# Patient Record
Sex: Male | Born: 1970 | Race: Black or African American | Hispanic: No | Marital: Married | Smoking: Heavy tobacco smoker
Health system: Southern US, Community
[De-identification: ages and names within clinical notes are randomized; demographics above are authoritative.]

## PROBLEM LIST (undated history)

## (undated) DIAGNOSIS — E785 Hyperlipidemia, unspecified: Secondary | ICD-10-CM

## (undated) DIAGNOSIS — R569 Unspecified convulsions: Secondary | ICD-10-CM

## (undated) HISTORY — PX: APPENDECTOMY: SHX54

---

## 2006-06-19 ENCOUNTER — Emergency Department: Payer: Self-pay | Admitting: General Practice

## 2006-06-19 ENCOUNTER — Other Ambulatory Visit: Payer: Self-pay

## 2007-04-14 ENCOUNTER — Emergency Department: Payer: Self-pay | Admitting: Emergency Medicine

## 2007-06-08 ENCOUNTER — Emergency Department: Payer: Self-pay

## 2008-02-13 ENCOUNTER — Emergency Department: Payer: Self-pay | Admitting: Emergency Medicine

## 2009-03-15 ENCOUNTER — Emergency Department: Payer: Self-pay | Admitting: Emergency Medicine

## 2009-10-10 ENCOUNTER — Emergency Department: Payer: Self-pay | Admitting: Emergency Medicine

## 2010-09-18 ENCOUNTER — Inpatient Hospital Stay: Payer: Self-pay | Admitting: Psychiatry

## 2011-01-29 ENCOUNTER — Inpatient Hospital Stay: Payer: Self-pay | Admitting: Internal Medicine

## 2011-12-18 ENCOUNTER — Emergency Department: Payer: Self-pay | Admitting: Emergency Medicine

## 2012-05-02 ENCOUNTER — Emergency Department: Payer: Self-pay | Admitting: Emergency Medicine

## 2012-09-27 ENCOUNTER — Inpatient Hospital Stay: Payer: Self-pay | Admitting: Psychiatry

## 2012-09-27 LAB — COMPREHENSIVE METABOLIC PANEL
Bilirubin,Total: 0.5 mg/dL (ref 0.2–1.0)
Calcium, Total: 8.7 mg/dL (ref 8.5–10.1)
Chloride: 106 mmol/L (ref 98–107)
Co2: 25 mmol/L (ref 21–32)
Creatinine: 1.21 mg/dL (ref 0.60–1.30)
EGFR (African American): >60
EGFR (Non-African Amer.): >60
Osmolality: 278 (ref 275–301)
Potassium: 2.9 mmol/L — ABNORMAL LOW (ref 3.5–5.1)
SGOT(AST): 25 U/L (ref 15–37)
SGPT (ALT): 19 U/L (ref 12–78)

## 2012-09-27 LAB — CBC
HCT: 39.6 % — ABNORMAL LOW (ref 40.0–52.0)
HGB: 13.3 g/dL (ref 13.0–18.0)
MCH: 33 pg (ref 26.0–34.0)
MCHC: 33.7 g/dL (ref 32.0–36.0)
MCV: 98 fL (ref 80–100)
Platelet: 153 10*3/uL (ref 150–440)
RBC: 4.04 10*6/uL — ABNORMAL LOW (ref 4.40–5.90)
RDW: 13.7 % (ref 11.5–14.5)
WBC: 8.1 10*3/uL (ref 3.8–10.6)

## 2012-09-27 LAB — DRUG SCREEN, URINE
Amphetamines, Ur Screen: NEGATIVE (ref ?–1000)
Benzodiazepine, Ur Scrn: NEGATIVE (ref ?–200)
Cocaine Metabolite,Ur ~~LOC~~: NEGATIVE (ref ?–300)
MDMA (Ecstasy)Ur Screen: NEGATIVE (ref ?–500)
Methadone, Ur Screen: NEGATIVE (ref ?–300)
Phencyclidine (PCP) Ur S: NEGATIVE (ref ?–25)
Tricyclic, Ur Screen: NEGATIVE (ref ?–1000)

## 2012-09-27 LAB — ACETAMINOPHEN LEVEL: Acetaminophen: <2 ug/mL

## 2012-09-27 LAB — ETHANOL
Ethanol %: <0.003 % (ref 0.000–0.080)
Ethanol: <3 mg/dL

## 2012-09-27 LAB — TSH: Thyroid Stimulating Horm: 3.13 u[IU]/mL

## 2012-09-28 LAB — BASIC METABOLIC PANEL
Anion Gap: 8 (ref 7–16)
BUN: 9 mg/dL (ref 7–18)
Co2: 23 mmol/L (ref 21–32)
Creatinine: 0.9 mg/dL (ref 0.60–1.30)
EGFR (African American): >60
EGFR (Non-African Amer.): >60
Osmolality: 280 (ref 275–301)
Sodium: 141 mmol/L (ref 136–145)

## 2013-01-22 ENCOUNTER — Emergency Department: Payer: Self-pay | Admitting: Emergency Medicine

## 2013-01-22 LAB — CBC
HGB: 11.5 g/dL — ABNORMAL LOW (ref 13.0–18.0)
MCHC: 31.5 g/dL — ABNORMAL LOW (ref 32.0–36.0)
MCV: 97 fL (ref 80–100)
Platelet: 123 10*3/uL — ABNORMAL LOW (ref 150–440)
RBC: 3.75 10*6/uL — ABNORMAL LOW (ref 4.40–5.90)
RDW: 13.5 % (ref 11.5–14.5)

## 2013-01-22 LAB — COMPREHENSIVE METABOLIC PANEL
Albumin: 3.6 g/dL (ref 3.4–5.0)
Anion Gap: 13 (ref 7–16)
BUN: 13 mg/dL (ref 7–18)
Bilirubin,Total: 0.3 mg/dL (ref 0.2–1.0)
Calcium, Total: 8.5 mg/dL (ref 8.5–10.1)
Chloride: 106 mmol/L (ref 98–107)
Co2: 21 mmol/L (ref 21–32)
Creatinine: 1.21 mg/dL (ref 0.60–1.30)
EGFR (African American): >60
EGFR (Non-African Amer.): >60
Glucose: 95 mg/dL (ref 65–99)
SGOT(AST): 43 U/L — ABNORMAL HIGH (ref 15–37)
Sodium: 140 mmol/L (ref 136–145)

## 2013-01-22 LAB — LIPASE, BLOOD: Lipase: 92 U/L (ref 73–393)

## 2013-01-22 LAB — PROTIME-INR: Prothrombin Time: 14 secs (ref 11.5–14.7)

## 2013-01-22 LAB — ETHANOL
Ethanol %: 0.2 % — ABNORMAL HIGH (ref 0.000–0.080)
Ethanol: 200 mg/dL

## 2013-02-17 ENCOUNTER — Encounter: Payer: Self-pay | Admitting: Family Medicine

## 2015-02-19 NOTE — H&P (Signed)
PATIENT NAME:  Danny Johnston, [name] MR#:  [id number] DATE OF BIRTH:  [birth year]  DATE OF ADMISSION:  09/27/2012  IDENTIFYING INFORMATION: The patient is a 44 year old African American male not employed and not worked in many years and is currently separated for many years after being married for six months. The comes for inpatient hospitalization on Psychiatry at [facility] with a chief complaint "my mother died at Thanksgiving time 10 years ago and this time is very depressing for me".   HISTORY OF PRESENT ILLNESS: The patient reports that he started feeling depressed just prior to Thanksgiving and he started feeling hopeless and helpless about himself and started having suicidal wishes and thoughts with no plan and so he called the Police Department and got a ride to the hospital. The patient reports that he started drinking alcohol and recently has been drinking on a regular basis about an 18 pack a day. Last drink was two weeks ago as he does not drink on a regular basis and drinks whenever he feels like it, probably in binges.   PAST PSYCHIATRIC HISTORY: History of inpatient hospitalization on Psychiatry three times before. First inpatient hospitalization on Psychiatry was in 1990 at [facility] and then second inpatient hospitalization was in 2002 at [facility]. Third inpatient hospitalization was in November 2011 at [facility]. This is the fourth inpatient hospitalization on Psychiatry. History of suicide attempts on two occasions with overdose on pills. The patient reports that he's not being followed by any psychiatrist at this time and he realizes that he needs to be on depression pills.   According to information obtained from the chart, the patient was discharged from [facility] in November 2011 on the following medications:  1. Flexeril 10 mg p.o. b.i.d. p.r.n. 2. Celexa 40 mg every day.  3. Abilify 5 mg every day.   The patient reports  he's noncompliant with medications, not taking medications, and not being followed by any psychiatrist as recommended.   FAMILY HISTORY OF MENTAL ILLNESS: Father was alcoholic.   PAST MEDICAL AND PAST SURGICAL HISTORY: According to information obtained from the chart, the patient has tonic-clonic seizure disorder and first seizure was in childhood. He had low back injury in 1987 with a slipped disk resulting in low back pain. Status post appendectomy at a very young age. No history of motor vehicle accident. Never been unconscious.   ALLERGIES: Is allergic to aspirin which causes hives.  HOME MEDICATIONS: Currently he is on no medications.   PRIMARY CARE PHYSICIAN: Not being followed by any physician on a regular basis at this time.   ALCOHOL AND DRUGS: The patient reports that he drinks alcohol whenever he can and sometimes he drinks about a 12 pack but not on a regular basis, whenever he feels like it. Admits that he does smoke THC whenever he can. Last smoked it a month ago. Denies using IV drugs. Does admit smoking nicotine cigarettes at a rate of two packs a day for many years.   PHYSICAL EXAMINATION:   VITAL SIGNS: Temperature 97.6, pulse 60 per minute and regular, respirations 18 per minute and regular, blood pressure 126/80 mmHg.   HEENT: Head is normocephalic and atraumatic. Pupils equal, round, and reactive to light and accommodation. Fundi bilaterally benign. EOMS visualized. Tympanic membranes visualized. No exudate.   NECK: Supple. No lymphadenopathy or thyromegaly.  CHEST: Normal expansion. Normal breath sounds heard.   HEART: Normal S1, S2 without any murmurs or gallops.  ABDOMEN: Soft. No organomegaly. Bowel sounds heard.   RECTAL: Deferred.   NEUROLOGIC: Gait is normal. Romberg is negative. Cranial nerves II through XII grossly intact. Deep tendon reflexes 2+. Plantars normal response.   MENTAL STATUS EXAMINATION: The patient is dressed in hospital clothes. He is  drowsy but is easily arousable. He knew where he was and the reason why he was here. No eye contact at all because he keeps his head down and fairly uncooperative. Speech is very soft and low and mumbling and difficult to understand and same question had to be asked again and again. Affect is flat. Mood depressed. Admits that he's feeling depressed and Thanksgiving is coming up and this is the time when his mother died 10 years ago. Does admit feeling hopeless and helpless, worthless and useless about himself because he's not able to do anything about what has happened in the past. Does admit suicidal thoughts and wishes but denies any suicidal plans and wants to get help. Denies auditory or visual hallucinations. Denies hearing things or seeing things. No paranoid or suspicious ideas. Cognition is below average. He could not spell the word world and he could not count money and could not do serial sevens. He knew his date of birth. Insight and judgment guarded.   IMPRESSION:  AXIS I: 1. Major depressive disorder, recurrent, without psychosis. 2. Alcohol dependence/abuse.  3. THC abuse/dependence. 4. Nicotine dependence.  AXIS II: Deferred.   AXIS III: 1. History of tonic/clonic seizure disorder. 2. Chronic low back pain secondary to injury in 1987. 3. Status post appendectomy.   AXIS IV: Severe. Occupation, financial, no family support, and long history of mental illness with depression and alcohol abuse and THC abuse.   AXIS V: GAF 20.   PLAN: The patient is admitted to [facility] for close observation, evaluation, and help. He will be started on medications per detox protocol. In addition, he will be started on Celexa which is increased to 40 mg p.o. daily for control of his depression, Abilify 5 mg p.o. daily which will act as an agent. During the stay in the hospital, he will be given milieu therapy and supportive counseling. He will take part in individual and group therapy with  coping skills and grief counseling and bereavement counseling will be given so that he can deal with the death of his mother on the anniversary of his mother's death. In addition, substance abuse counseling will be given. At the time of discharge, the patient will have enough coping skills in dealing with stress of death of his mother and dealing with during Thanksgiving time. Appropriate follow-up appointment will be made in the community.   ____________________________ Jannet Mantis. Guss Bunde, MD skc:drc D: 09/27/2012 20:12:10 ET T: 09/28/2012 06:14:37 ET JOB#: [id number]  cc: Monika Salk K. Guss Bunde, MD, <Dictator> Beau Fanny MD ELECTRONICALLY SIGNED 09/28/2012 15:15

## 2015-02-19 NOTE — H&P (Signed)
PATIENT NAME:  Danny Johnston, Danny Johnston MR#:  [id number] DATE OF BIRTH:  January 25, 1971  DATE OF ADMISSION:  09/27/2012  IDENTIFYING INFORMATION: A 44 year old man came to the Emergency Room reporting suicidal ideation.   CHIEF COMPLAINT: His chief complaint to me, "suicidal thoughts and my mother's death."   HISTORY OF PRESENT ILLNESS: Information is obtained from the patient and from the chart. The patient says that he has been feeling more depressed recently. He is having suicidal thoughts. He is vague about whether he has a specific plan but says that they have been getting worse and more persistent. He feels like nobody loves him and nobody wants him around. He has negative thoughts about himself frequently. He is sleeping poorly at night with poor appetite. He denies any hallucinations. He denies homicidal ideation. He is not currently taking any psychiatric medicine. He has been off of it for probably almost a year, maybe even more. He admits that he was drinking heavily until last week but says that he stopped before the weekend. He admits that he still uses marijuana 2 or 3 times a week. The patient also has the stress of breaking up with a girlfriend earlier this week. He had been living with her for about a month and then she told him she did not want him to live there anymore, so he had to leave. This is apparently the most acute thing that got him really depressed. Right now he says that he can stay with a cousin when he gets out of the hospital, but he just feels very bad about himself.   PAST PSYCHIATRIC HISTORY: The patient has had previous admissions to the hospital for severe depression. He has had admissions as far back here as 2002. He has a history of making serious suicide attempts in the past. He has been on medications, including Depakote and Prozac. Last time he was in the hospital, he was put on Abilify and Celexa and seem to have a good response to them. He tells me that he did follow up for a  while with outpatient treatment but after a while was taken off the Abilify. He thinks that was a mistake because he thought that the Abilify was helpful. The patient has a history of getting easily overwhelmed and depressed. He has a past history of pseudoseizure behaviors when his anxiety is particularly high. He also evidently has a history of chronic developmental disability, the degree of which is not clear to me.   SUBSTANCE ABUSE HISTORY: He has not always had a substance abuse problem, but the alcohol seems to have started in the last few years. He says that he binge drinks. He denies having seizures or delirium tremens. He admits that he still uses marijuana regularly. He has no insight into that having an effect on his mood.   SOCIAL HISTORY: The patient's parents are both deceased, having both died about 10 years ago. Ever since then, it seems like it has been hard for him to take care of himself. He does get an SSI check but does not have a stable place to live. Recently he has been staying with family. As I mentioned, he recently was staying with a woman who then put him out of the house, which is the acute trigger for his depression.   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 Otherwise, he knows of no significant ongoing  medical problems. He has a past history of iron deficiency anemia and chronic thrombocytopenia.   REVIEW OF SYSTEMS: He complains of depressed mood, fatigue, lack of interest in normal activities, suicidal thoughts. He denies auditory or visual hallucinations. He denies homicidal ideation. He denies anger problems or anxiety attacks.   MENTAL STATUS EXAM: A thin, slightly disheveled man who looks his stated age. He was passively cooperative. He made almost no eye contact during the interview. He looked down at his lap the whole time. Psychomotor activity was extremely slow and  restricted. Speech was quiet and decreased in total amount. Thoughts are notable for thought blocking and slowness. No grossly bizarre or disorganized statements. He denies auditory or visual hallucinations. He endorses suicidal ideation without a specific plan. He denies homicidal ideation. Baseline intelligence is low average to possibly mild MR. Short-term memory appears to grossly be intact. Insight and judgment are somewhat impaired.   PHYSICAL EXAMINATION:  GENERAL: A thin man who does not appear to be in any acute physical distress. His skin is dry and has some old scars but no acute wounds on it.   HEENT: Pupils are equal and reactive. Face is symmetric. Dentition is poor but not acutely damaged. Oral mucosa are dry.   NECK AND BACK: Nontender.   NEUROMUSCULOSKELETAL: Gait is normal. Full range of motion in all extremities. Strength and reflexes are normal upper and lower and symmetric. Cranial nerves are symmetric and normal.  LUNGS: Clear without wheezing.   HEART: Regular rate and rhythm. Normal sounds.   ABDOMEN: Soft, nontender, normal bowel sounds.   VITAL SIGNS: Temperature 97.7, pulse 54, respirations 18, blood pressure 136/80.   LABORATORY RESULTS: Labs in the Emergency Room show a drug screen positive for cannabis, otherwise negative. TSH normal at 3.13. Alcohol undetectable. Potassium is low at 2.9, glucose elevated at 115. CBC does show a hematocrit low at 39.6. Platelets are normal but at the low end at 153. Salicylates are slightly elevated but not toxic at 5.8. Acetaminophen not detected.   CURRENT MEDICATIONS: Not taking any.   ALLERGIES: Aspirin and Tylenol.   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 He is  having suicidal ideation. Poor self care. He is feeling very badly about himself. He has not been getting outpatient treatment recently and has still been abusing substances. He needs hospitalization for stabilization of depression and improvement of suicidal thinking. He does not appear right now to probably physically need alcohol withdrawal treatment but will be monitored for it. Also he  has a low potassium which is going to be supplemented.   TREATMENT PLAN: In the Emergency Room, orders were put in to supplement him with oral potassium, and we will recheck the level tomorrow and follow up with that. I am putting him back on citalopram 20 mg a day as well as Abilify 5 mg a day which he was on previously. Supportive therapy, education, and encouragement were done. I encouraged him to go to groups and be active talking about his stressors. The full treatment team will work him up and see if we can help with his social situation as well.   DIAGNOSIS, PRINCIPAL AND PRIMARY:  AXIS I: Major depression, severe, recurrent.   SECONDARY DIAGNOSES:  AXIS I: Alcohol dependence.  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   AXIS III: No diagnosis.   AXIS IV: Severe from lack of a place to live and minimal social support.   AXIS V: Functioning at time of evaluation 35.    ____________________________ Audery Amel, MD jtc:cbb D: 09/27/2012 14:36:30 ET T: 09/27/2012 15:16:05 ET JOB#: [id number]  cc: Audery Amel, MD, <Dictator> Audery Amel MD ELECTRONICALLY SIGNED 09/27/2012 16:01

## 2015-02-19 NOTE — Discharge Summary (Signed)
PATIENT NAME:  Danny Johnston, Danny Johnston MR#:  [id number] DATE OF BIRTH:  [birth year]  DATE OF ADMISSION:  09/27/2012 DATE OF DISCHARGE:  10/03/2012  HOSPITAL COURSE: See dictated History and Physical for details of admission. This 44 year old man was brought to the hospital under involuntary petition. He was stating that his mood was extremely depressed. He was having suicidal thoughts. He was thinking about the fact that his mother had died around Thanksgiving. He was feeling hopeless and somewhat agitated. He had been off of his medication and also had returned to drinking recently although he had stopped probably a week or so prior to the hospital stay. He did not have a stable place to continue to live. He had been using marijuana frequently. The patient was cooperative and pleasant and showed good insight about his depression. In the past he had had a good response to a combination of Celexa and Abilify. He was restarted on Celexa 40 mg a day and Abilify at 5 mg a day. He has tolerated these medicines well. Over the last several days he has shown a substantial increase in his mood, his optimism.  He has upbeat thinking. On interview today the patient is smiling and upbeat. He totally denies any suicidal ideation. He is able to articulate several positive things about his life that he is looking forward to and wants to live for. He says it is very important for him to stop drinking and stop using drugs because he knows those have been holding him back. [facility] and has [facility]. The patient has been educated about [facility] and states that he is very willing and interested in going there. After that he thinks he would like to be in a group home and definitely wants to continue taking his psychiatric medicine. He has denied today any kind of psychotic symptoms. He is not aggressive or violent. He has not had any medication side effects that are obvious. He will  be transferred today under his involuntary commitment directly to [facility] for follow-up treatment.   LABORATORY STUDIES: Admission labs showed a chemistry with a slightly elevated glucose of 115 on a nonfasting draw. Potassium low at 2.9. Alcohol undetectable. TSH normal at 3.13. Drug screen positive for cannabis. On followup on the next day, his potassium had normalized to 3.8. His CBC showed a slightly low hematocrit at 39.6 and has not been repeated. Acetaminophen normal. Salicylates slightly elevated at 5.8.   DISCHARGE MEDICATIONS:  1. Citalopram 40 mg per day.  2. Abilify 5 mg per day.   MENTAL STATUS EXAM AT DISCHARGE: Casually dressed, neatly groomed man who looks his stated age. Pleasant and cooperative in the interview. Good eye contact. Normal psychomotor activity. Speech is normal in rate, tone, and volume. Affect is smiling, upbeat, reactive, and appropriate. Mood is stated as being good. Thoughts appear lucid with no loosening of associations or delusional thinking. No sign of thought disorder. Denies auditory or visual hallucinations. Denies suicidal or homicidal ideation. Insight and judgment are good. Short and long-term memory intact. It is not clear whether he has a chronic developmental disability. If he does, it is mild at most. There may be a little slowness to his thinking but it is not enough to impair his ability to make good use of therapy.   DISPOSITION: Transfer to [facility].   DIAGNOSIS PRINCIPLE AND PRIMARY:  AXIS I: Major depression, severe, recurrent, with psychotic features.   SECONDARY DIAGNOSES:  AXIS I:  1. Alcohol dependence.  2. Marijuana abuse.   AXIS II: Rule out developmental disability, not otherwise specified.   AXIS III: No diagnosis.   AXIS IV: Severe, currently from homelessness and recent lack of treatment.   AXIS V: Functioning at time of discharge: 55.   ____________________________ Audery Amel, MD jtc:bjt D: 10/03/2012 11:18:55  ET T: 10/03/2012 11:38:57 ET JOB#: [id number]  cc: Audery Amel, MD, <Dictator> Audery Amel MD ELECTRONICALLY SIGNED 10/03/2012 17:47

## 2015-02-19 NOTE — Consult Note (Signed)
Brief Consult Note: Diagnosis: MDE.   Patient was seen by consultant.   Recommend further assessment or treatment.   Orders entered.   Comments: Psychiatry: Patient with severe depression and SI. Slow, hopeless, not functioning. Also alcohol dep. Neds admission to treat SI and depression and restart outpt trreatment. Admission workup and orders done.  Electronic Signatures: Audery Amellapacs, Nadie Fiumara T (MD)  (Signed [phone]-Nov-13 14:37)  Authored: Brief Consult Note   Last Updated: 26-Nov-13 14:37 by Audery Amellapacs, Mccoy Testa T (MD)

## 2016-01-16 ENCOUNTER — Emergency Department
Admission: EM | Admit: 2016-01-16 | Discharge: 2016-01-16 | Disposition: A | Discharge status: Home / Self Care | Payer: Medicaid Other | Attending: Emergency Medicine | Admitting: Emergency Medicine

## 2016-01-16 ENCOUNTER — Encounter: Payer: Self-pay | Admitting: *Deleted

## 2016-01-16 DIAGNOSIS — W25XXXA Contact with sharp glass, initial encounter: Secondary | ICD-10-CM | POA: Diagnosis not present

## 2016-01-16 DIAGNOSIS — Y9289 Other specified places as the place of occurrence of the external cause: Secondary | ICD-10-CM | POA: Diagnosis not present

## 2016-01-16 DIAGNOSIS — Y998 Other external cause status: Secondary | ICD-10-CM | POA: Insufficient documentation

## 2016-01-16 DIAGNOSIS — F17210 Nicotine dependence, cigarettes, uncomplicated: Secondary | ICD-10-CM | POA: Diagnosis not present

## 2016-01-16 DIAGNOSIS — Y9389 Activity, other specified: Secondary | ICD-10-CM | POA: Insufficient documentation

## 2016-01-16 DIAGNOSIS — Z79899 Other long term (current) drug therapy: Secondary | ICD-10-CM | POA: Insufficient documentation

## 2016-01-16 DIAGNOSIS — S81812A Laceration without foreign body, left lower leg, initial encounter: Secondary | ICD-10-CM

## 2016-01-16 HISTORY — DX: Unspecified convulsions: R56.9

## 2016-01-16 MED ORDER — LIDOCAINE HCL (PF) 1 % IJ SOLN
INTRAMUSCULAR | Status: AC
  Filled 2016-01-16: qty 10

## 2016-01-16 MED ORDER — LIDOCAINE HCL (PF) 1 % IJ SOLN
10.0000 mL | Freq: Once | INTRAMUSCULAR | Status: DC
Start: 2016-01-16 — End: 2016-01-16

## 2016-01-16 NOTE — ED Notes (Signed)
See triage note   States he was helping to break down some metal  And a glass broke  Small laceration noted to left lower leg

## 2016-01-16 NOTE — ED Notes (Signed)
Pt sustained a laceration to left leg while working with a piece of  Glass, no bleeding noted, dressing applied

## 2016-01-16 NOTE — ED Provider Notes (Signed)
[facility] Emergency Department Provider Note  ____________________________________________  Time seen: Approximately 12:45 PM  I have reviewed the triage vital signs and the nursing notes.   HISTORY  Chief Complaint Laceration   HPI Danny Johnston is a 45 y.o. male is here with complaint of laceration to his left leg while taking aluminum off a large piece of glass. Patient states that the glass broke and he has a small laceration to the left lower leg. He states he has had a tetanus immunization within last 5 years.He rates his pain as 5/10.   Past Medical History  Diagnosis Date  . Seizures (HCC)     There are no active problems to display for this patient.   History reviewed. No pertinent past surgical history.  Current Outpatient Rx  Name  Route  Sig  Dispense  Refill  . divalproex (DEPAKOTE ER) 500 MG 24 hr tablet   Oral   Take 500 mg by mouth daily.           Allergies Review of patient's allergies indicates no known allergies.  No family history on file.  Social History Social History  Substance Use Topics  . Smoking status: Heavy Tobacco Smoker -- 2.00 packs/day    Types: Cigarettes  . Smokeless tobacco: None  . Alcohol Use: No    Review of Systems Constitutional: No fever/chills Cardiovascular: Denies chest pain. Respiratory: Denies shortness of breath. Skin: Positive laceration. Neurological: Negative for  focal weakness or numbness.  10-point ROS otherwise negative.  ____________________________________________   PHYSICAL EXAM:  VITAL SIGNS: ED Triage Vitals  Enc Vitals Group     BP 01/16/16 1050 125/82 mmHg     Pulse Rate 01/16/16 1050 82     Resp 01/16/16 1050 20     Temp 01/16/16 1050 97.8 F (36.6 C)     Temp Source 01/16/16 1050 Oral     SpO2 01/16/16 1050 99 %     Weight 01/16/16 1050 160 lb (72.576 kg)     Height 01/16/16 1050  (1.88 m)     Head Cir --      Peak Flow --      Pain Score  01/16/16 1050 5     Pain Loc --      Pain Edu? --      Excl. in GC? --     Constitutional: Alert and oriented. Well appearing and in no acute distress. Eyes: Conjunctivae are normal. PERRL. EOMI. Head: Atraumatic. Nose: No congestion/rhinnorhea. Neck: No stridor.   Cardiovascular: Normal rate, regular rhythm. Grossly normal heart sounds.  Good peripheral circulation. Respiratory: Normal respiratory effort.  No retractions. Lungs CTAB. Musculoskeletal: No lower extremity tenderness nor edema.   Neurologic:  Normal speech and language. No gross focal neurologic deficits are appreciated. No gait instability. Skin:  Left lateral leg there is a 3.5 cm laceration without active bleeding. No foreign body is noted. Psychiatric: Mood and affect are normal. Speech and behavior are normal.  ____________________________________________   LABS (all labs ordered are listed, but only abnormal results are displayed)  Labs Reviewed - No data to display  PROCEDURES  Procedure(s) performed: LACERATION REPAIR Performed by: Tommi Rumps Authorized by: Tommi Rumps Consent: Verbal consent obtained. Risks and benefits: risks, benefits and alternatives were discussed Consent given by: patient Patient identity confirmed: provided demographic data Prepped and Draped in normal sterile fashion Wound explored  Laceration Location: Left lower lateral leg.  Laceration Length: 3.5 cm  No Foreign  Bodies seen or palpated  Anesthesia: local infiltration  Local anesthetic: lidocaine 1 % without epinephrine  Anesthetic total: 8 ml  Irrigation method: syringe Amount of cleaning: standard  Skin closure: 4-0 Ethilon   Number of sutures: 10  Technique: Simple interrupted   Patient tolerance: Patient tolerated the procedure well with no immediate complications.  Critical Care performed: No  ____________________________________________   INITIAL IMPRESSION / ASSESSMENT AND PLAN / ED  COURSE  Pertinent labs & imaging results that were available during my care of the patient were reviewed by me and considered in my medical decision making (see chart for details).  Patient denies the need for narcotics for pain. He is instructed to keep area clean and dry and watch for signs of infection. He is also given wound care instructions. He is to return to the emergency room if unable to go to urgent care for suture removal in 10-12 days. ____________________________________________   FINAL CLINICAL IMPRESSION(S) / ED DIAGNOSES  Final diagnoses:  Laceration of left leg, initial encounter      Tommi RumpsRhonda L Summers, PA-C 01/16/16 1528  Jennye MoccasinBrian S Quigley, MD 01/16/16 1547

## 2016-01-16 NOTE — Discharge Instructions (Signed)
Laceration Care, Adult  A laceration is a cut that goes through all layers of the skin. The cut also goes into the tissue that is right under the skin. Some cuts heal on their own. Others need to be closed with stitches (sutures), staples, skin adhesive strips, or wound glue. Taking care of your cut lowers your risk of infection and helps your cut to heal better.  HOW TO TAKE CARE OF YOUR CUT  For stitches or staples:  · Keep the wound clean and dry.  · If you were given a bandage (dressing), you should change it at least one time per day or as told by your doctor. You should also change it if it gets wet or dirty.  · Keep the wound completely dry for the first 24 hours or as told by your doctor. After that time, you may take a shower or a bath. However, make sure that the wound is not soaked in water until after the stitches or staples have been removed.  · Clean the wound one time each day or as told by your doctor:    Wash the wound with soap and water.    Rinse the wound with water until all of the soap comes off.    Pat the wound dry with a clean towel. Do not rub the wound.  · After you clean the wound, put a thin layer of antibiotic ointment on it as told by your doctor. This ointment:    Helps to prevent infection.    Keeps the bandage from sticking to the wound.  · Have your stitches or staples removed as told by your doctor.  If your doctor used skin adhesive strips:   · Keep the wound clean and dry.  · If you were given a bandage, you should change it at least one time per day or as told by your doctor. You should also change it if it gets dirty or wet.  · Do not get the skin adhesive strips wet. You can take a shower or a bath, but be careful to keep the wound dry.  · If the wound gets wet, pat it dry with a clean towel. Do not rub the wound.  · Skin adhesive strips fall off on their own. You can trim the strips as the wound heals. Do not remove any strips that are still stuck to the wound. They will  fall off after a while.  If your doctor used wound glue:  · Try to keep your wound dry, but you may briefly wet it in the shower or bath. Do not soak the wound in water, such as by swimming.  · After you take a shower or a bath, gently pat the wound dry with a clean towel. Do not rub the wound.  · Do not do any activities that will make you really sweaty until the skin glue has fallen off on its own.  · Do not apply liquid, cream, or ointment medicine to your wound while the skin glue is still on.  · If you were given a bandage, you should change it at least one time per day or as told by your doctor. You should also change it if it gets dirty or wet.  · If a bandage is placed over the wound, do not let the tape for the bandage touch the skin glue.  · Do not pick at the glue. The skin glue usually stays on for 5-10 days. Then, it   or when wound glue stays in place and the wound is healed. Make sure to wear a sunscreen of at least 30 SPF. °· Take over-the-counter and prescription medicines only as told by your doctor. °· If you were given antibiotic medicine or ointment, take or apply it as told by your doctor. Do not stop using the antibiotic even if your wound is getting better. °· Do not scratch or pick at the wound. °· Keep all follow-up visits as told by your doctor. This is important. °· Check your wound every day for signs of infection. Watch for: °¨ Redness, swelling, or pain. °¨ Fluid, blood, or pus. °· Raise (elevate) the injured area above the level of your heart while you are sitting or lying down, if possible. °GET HELP IF: °· You got a tetanus shot and you have any of these problems at the injection site: °¨ Swelling. °¨ Very bad pain. °¨ Redness. °¨ Bleeding. °· You have a fever. °· A wound that was  closed breaks open. °· You notice a bad smell coming from your wound or your bandage. °· You notice something coming out of the wound, such as wood or glass. °· Medicine does not help your pain. °· You have more redness, swelling, or pain at the site of your wound. °· You have fluid, blood, or pus coming from your wound. °· You notice a change in the color of your skin near your wound. °· You need to change the bandage often because fluid, blood, or pus is coming from the wound. °· You start to have a new rash. °· You start to have numbness around the wound. °GET HELP RIGHT AWAY IF: °· You have very bad swelling around the wound. °· Your pain suddenly gets worse and is very bad. °· You notice painful lumps near the wound or on skin that is anywhere on your body. °· You have a red streak going away from your wound. °· The wound is on your hand or foot and you cannot move a finger or toe like you usually can. °· The wound is on your hand or foot and you notice that your fingers or toes look pale or bluish. °  °This information is not intended to replace advice given to you by your health care provider. Make sure you discuss any questions you have with your health care provider. °  °Document Released: 04/06/2008 Document Revised: 03/05/2015 Document Reviewed: 10/15/2014 °Elsevier Interactive Patient Education ©2016 [employer] ° ° °WOUND CARE °Please return in 10-12 days to have your °stitches/staples removed or sooner if you have concerns. °• Keep area clean and dry for 24 hours. Do not remove °bandage, if applied. °• After 24 hours, remove bandage and wash wound °gently with mild soap and warm water. Reapply °a new bandage after cleaning wound, if directed. °• Continue daily cleansing with soap and water until °stitches/staples are removed. °• Do not apply any ointments or creams to the wound °while stitches/staples are in place, as this may cause °delayed healing. °• Notify the office if you experience any of the  following °signs of infection: Swelling, redness, pus drainage, °streaking, fever >101.0 F °• Notify the office if you experience excessive bleeding °that does not stop after 15-20 minutes of constant, firm °pressure. ° ° ° °

## 2016-01-31 ENCOUNTER — Emergency Department
Admission: EM | Admit: 2016-01-31 | Discharge: 2016-01-31 | Disposition: A | Discharge status: Home / Self Care | Payer: Medicaid Other | Attending: Student | Admitting: Student

## 2016-01-31 ENCOUNTER — Encounter: Payer: Self-pay | Admitting: Emergency Medicine

## 2016-01-31 DIAGNOSIS — Z4802 Encounter for removal of sutures: Secondary | ICD-10-CM

## 2016-01-31 DIAGNOSIS — F17210 Nicotine dependence, cigarettes, uncomplicated: Secondary | ICD-10-CM | POA: Insufficient documentation

## 2016-01-31 DIAGNOSIS — R569 Unspecified convulsions: Secondary | ICD-10-CM | POA: Insufficient documentation

## 2016-01-31 NOTE — ED Notes (Signed)
Patient comes into ED via POV for suture removal from left lower leg.  Patient in NAD at this time.

## 2016-01-31 NOTE — Discharge Instructions (Signed)

## 2016-01-31 NOTE — ED Provider Notes (Signed)
[facility] Emergency Department Provider Note  ____________________________________________  Time seen: Approximately 11:41 AM  I have reviewed the triage vital signs and the nursing notes.   HISTORY  Chief Complaint Suture / Staple Removal    HPI Danny RodneyGlenn L Brownstein Jr. is a 45 y.o. male patient today for suture removal from left lower leg which was performed this ED on 01/16/2016. Patient state no complaints.   Past Medical History  Diagnosis Date  . Seizures (HCC)     There are no active problems to display for this patient.   History reviewed. No pertinent past surgical history.  Current Outpatient Rx  Name  Route  Sig  Dispense  Refill  . divalproex (DEPAKOTE ER) 500 MG 24 hr tablet   Oral   Take 500 mg by mouth daily.           Allergies Review of patient's allergies indicates no known allergies.  No family history on file.  Social History Social History  Substance Use Topics  . Smoking status: Heavy Tobacco Smoker -- 2.00 packs/day    Types: Cigarettes  . Smokeless tobacco: None  . Alcohol Use: No    Review of Systems Constitutional: No fever/chills Eyes: No visual changes. ENT: No sore throat. Cardiovascular: Denies chest pain. Respiratory: Denies shortness of breath. Gastrointestinal: No abdominal pain.  No nausea, no vomiting.  No diarrhea.  No constipation. Genitourinary: Negative for dysuria. Musculoskeletal: Negative for back pain. Skin: Negative for rash. Laceration left lower leg Neurological: Negative for headaches, focal weakness or numbness. .  ____________________________________________   PHYSICAL EXAM:  VITAL SIGNS: ED Triage Vitals  Enc Vitals Group     BP 01/31/16 1128 122/71 mmHg     Pulse Rate 01/31/16 1128 69     Resp 01/31/16 1128 16     Temp 01/31/16 1128 98 F (36.7 C)     Temp Source 01/31/16 1128 Oral     SpO2 01/31/16 1128 97 %     Weight 01/31/16 1128 160 lb (72.576 kg)     Height  01/31/16 1128 6\' 3"  (1.905 m)     Head Cir --      Peak Flow --      Pain Score --      Pain Loc --      Pain Edu? --      Excl. in GC? --     Constitutional: Alert and oriented. Well appearing and in no acute distress. Eyes: Conjunctivae are normal. PERRL. EOMI. Head: Atraumatic. Nose: No congestion/rhinnorhea. Mouth/Throat: Mucous membranes are moist.  Oropharynx non-erythematous. Neck: No stridor.  No cervical spine tenderness to palpation. Cardiovascular: Normal rate, regular rhythm. Grossly normal heart sounds.  Good peripheral circulation. Respiratory: Normal respiratory effort.  No retractions. Lungs CTAB. Gastrointestinal: Soft and nontender. No distention. No abdominal bruits. No CVA tenderness. Musculoskeletal: No lower extremity tenderness nor edema.  No joint effusions. Neurologic:  Normal speech and language. No gross focal neurologic deficits are appreciated. No gait instability. Skin:  Skin is warm, dry and intact. No rash noted. Healed laceration left lower leg Psychiatric: Mood and affect are normal. Speech and behavior are normal.  ____________________________________________   LABS (all labs ordered are listed, but only abnormal results are displayed)  Labs Reviewed - No data to display ____________________________________________  EKG   ____________________________________________  RADIOLOGY   ____________________________________________   PROCEDURES  Procedure(s) performed: None  Critical Care performed: No  ____________________________________________   INITIAL IMPRESSION / ASSESSMENT AND PLAN / ED  COURSE  Pertinent labs & imaging results that were available during my care of the patient were reviewed by me and considered in my medical decision making (see chart for details).  Healed laceration left lower leg 10 sutures were removed area was cleaned and repaired bandage. Patient given advice on wound care. Advised return back to ER if the  wound reopens. ____________________________________________   FINAL CLINICAL IMPRESSION(S) / ED DIAGNOSES  Final diagnoses:  Encounter for removal of sutures      Joni Reining, PA-C 01/31/16 1151  Gayla Doss, MD 01/31/16 [phone]

## 2016-04-22 DIAGNOSIS — Y999 Unspecified external cause status: Secondary | ICD-10-CM | POA: Diagnosis not present

## 2016-04-22 DIAGNOSIS — Y939 Activity, unspecified: Secondary | ICD-10-CM | POA: Insufficient documentation

## 2016-04-22 DIAGNOSIS — S61217A Laceration without foreign body of left little finger without damage to nail, initial encounter: Secondary | ICD-10-CM | POA: Diagnosis not present

## 2016-04-22 DIAGNOSIS — W293XXA Contact with powered garden and outdoor hand tools and machinery, initial encounter: Secondary | ICD-10-CM | POA: Insufficient documentation

## 2016-04-22 DIAGNOSIS — F17210 Nicotine dependence, cigarettes, uncomplicated: Secondary | ICD-10-CM | POA: Insufficient documentation

## 2016-04-22 DIAGNOSIS — Y929 Unspecified place or not applicable: Secondary | ICD-10-CM | POA: Diagnosis not present

## 2016-04-23 ENCOUNTER — Emergency Department
Admission: EM | Admit: 2016-04-23 | Discharge: 2016-04-23 | Disposition: A | Discharge status: Home / Self Care | Payer: Medicaid Other | Attending: Emergency Medicine | Admitting: Emergency Medicine

## 2016-04-23 ENCOUNTER — Emergency Department: Payer: Medicaid Other

## 2016-04-23 DIAGNOSIS — S61217A Laceration without foreign body of left little finger without damage to nail, initial encounter: Secondary | ICD-10-CM

## 2016-04-23 MED ORDER — LIDOCAINE HCL (PF) 1 % IJ SOLN
5.0000 mL | Freq: Once | INTRAMUSCULAR | Status: AC
  Administered 2016-04-23: 5 mL via INTRADERMAL

## 2016-04-23 MED ORDER — CEPHALEXIN 500 MG PO CAPS
500.0000 mg | ORAL_CAPSULE | Freq: Once | ORAL | Status: AC
Start: 2016-04-23 — End: 2016-04-23
  Administered 2016-04-23: 500 mg via ORAL
  Filled 2016-04-23: qty 1

## 2016-04-23 MED ORDER — CEPHALEXIN 500 MG PO CAPS: 500.0000 mg | ORAL_CAPSULE | Freq: Once | ORAL | Status: DC

## 2016-04-23 MED ORDER — CEPHALEXIN 500 MG PO CAPS: 500.0000 mg | ORAL_CAPSULE | Freq: Two times a day (BID) | ORAL | Status: AC

## 2016-04-23 MED ORDER — BACITRACIN ZINC 500 UNIT/GM EX OINT
TOPICAL_OINTMENT | Freq: Once | CUTANEOUS | Status: AC
  Administered 2016-04-23: 1 via TOPICAL
  Filled 2016-04-23: qty 0.9

## 2016-04-23 MED ORDER — LIDOCAINE HCL (PF) 1 % IJ SOLN
INTRAMUSCULAR | Status: AC
  Administered 2016-04-23: 5 mL via INTRADERMAL
  Filled 2016-04-23: qty 5

## 2016-04-23 NOTE — ED Notes (Signed)
Patient was trimming hedges with an electric trimmer and cut his left fifth digit. No bleeding at the present time, new dressing placed. Patient will require laceration repair. Motor/sensation intact.

## 2016-04-23 NOTE — ED Provider Notes (Signed)
[facility] Emergency Department Provider Note  ____________________________________________  Time seen: 5:00 AM  I have reviewed the triage vital signs and the nursing notes.   HISTORY  Chief Complaint Laceration     HPI Danny RodneyGlenn L Jurgensen Jr. is a 45 y.o. male presents with left fifth finger laceration sustained while cutting "bushes yesterday at approximately 5 PM with an electric trimmer. Patient states current pain score is 5 out of 10.   Past Medical History  Diagnosis Date  . Seizures (HCC)     There are no active problems to display for this patient.   History reviewed. No pertinent past surgical history.  Current Outpatient Rx  Name  Route  Sig  Dispense  Refill  . divalproex (DEPAKOTE ER) 500 MG 24 hr tablet   Oral   Take 500 mg by mouth daily.           Allergies Review of patient's allergies indicates no known allergies.  No family history on file.  Social History Social History  Substance Use Topics  . Smoking status: Heavy Tobacco Smoker -- 2.00 packs/day    Types: Cigarettes  . Smokeless tobacco: None  . Alcohol Use: No    Review of Systems  Constitutional: Negative for fever. Eyes: Negative for visual changes. ENT: Negative for sore throat. Cardiovascular: Negative for chest pain. Respiratory: Negative for shortness of breath. Gastrointestinal: Negative for abdominal pain, vomiting and diarrhea. Genitourinary: Negative for dysuria. Musculoskeletal: Negative for back pain. Skin: Negative for rash.Positive for left fifth finger laceration Neurological: Negative for headaches, focal weakness or numbness.   10-point ROS otherwise negative.  ____________________________________________   PHYSICAL EXAM:  VITAL SIGNS: ED Triage Vitals  Enc Vitals Group     BP 04/23/16 0056 123/80 mmHg     Pulse Rate 04/23/16 0056 57     Resp 04/23/16 0056 18     Temp 04/23/16 0056 97.6 F (36.4 C)     Temp Source 04/23/16  0056 Oral     SpO2 04/23/16 0056 100 %     Weight 04/23/16 0056 150 lb (68.04 kg)     Height 04/23/16 0056 6\' 3"  (1.905 m)     Head Cir --      Peak Flow --      Pain Score 04/23/16 0057 8     Pain Loc --      Pain Edu? --      Excl. in GC? --      Constitutional: Alert and oriented. Well appearing and in no distress. Eyes: Conjunctivae are normal. PERRL. Normal extraocular movements. ENT   Head: Normocephalic and atraumatic.   Nose: No congestion/rhinnorhea.   Mouth/Throat: Mucous membranes are moist.   Neck: No stridor. Hematological/Lymphatic/Immunilogical: No cervical lymphadenopathy. Cardiovascular: Normal rate, regular rhythm. Normal and symmetric distal pulses are present in all extremities. No murmurs, rubs, or gallops. Respiratory: Normal respiratory effort without tachypnea nor retractions. Breath sounds are clear and equal bilaterally. No wheezes/rales/rhonchi. Gastrointestinal: Soft and nontender. No distention. There is no CVA tenderness. Genitourinary: deferred Musculoskeletal: Nontender with normal range of motion in all extremities. No joint effusions.  No lower extremity tenderness nor edema. Neurologic:  Normal speech and language. No gross focal neurologic deficits are appreciated. Speech is normal.  Skin:  Skin is warm, dry and intact. No rash noted.2 cm left fifth finger distal laceration Psychiatric: Mood and affect are normal. Speech and behavior are normal. Patient exhibits appropriate insight and judgment.  Procedure note: LACERATION REPAIR Performed by:  [place] N Eddie Payette Authorized by: Darci CurrentANDOLPH N Karolyne Timmons Consent: Verbal consent obtained. Risks and benefits: risks, benefits and alternatives were discussed Consent given by: patient Patient identity confirmed: provided demographic data Prepped and Draped in normal sterile fashion Wound explored  Laceration Location: Left fifth finger distally  Laceration Length: 2cm  No Foreign Bodies seen  or palpated  Anesthesia: local infiltration  Local anesthetic: lidocaine 1%   Anesthetic total: 2 ml  Irrigation method: syringe Amount of cleaning: standard  Skin closure: 5-0 nylon   Number of sutures: 6  Technique: Simple interrupted   Patient tolerance: Patient tolerated the procedure well with no immediate complications.    INITIAL IMPRESSION / ASSESSMENT AND PLAN / ED COURSE  Pertinent labs & imaging results that were available during my care of the patient were reviewed by me and considered in my medical decision making (see chart for details).    ____________________________________________   FINAL CLINICAL IMPRESSION(S) / ED DIAGNOSES  Final diagnoses:  Laceration of fifth finger, left, initial encounter      Darci Currentandolph N Aamya Orellana, MD 04/23/16 [phone]

## 2016-04-23 NOTE — ED Notes (Signed)
Pt presents to ED with c/o laceration to 5th digit on LEFT hand. Pt states he was attempting to unplug a corded electric hedge trimmer when he accidentally depressed the trigger switch while doing so. Wound not visualized at this time as pt presents to ED Room 19 with injury covered with gauze and wrapped in Kerlex from Triage (very small amount of blood noted to have saturated gauze, bleeding controlled at this time); will assess in greater depth when EDP presents to see pt.

## 2016-04-23 NOTE — ED Notes (Signed)
Pt reports Tetanus shot is UTD.

## 2016-04-23 NOTE — Discharge Instructions (Signed)

## 2016-04-23 NOTE — ED Notes (Signed)
Pt presents with an approximately 1-1.25 inch laceration across the distal pad on the 5th digit. No bleeding noted at this time. Dr Manson PasseyBrown at bedside to suture wound.

## 2016-09-09 ENCOUNTER — Encounter: Payer: Self-pay | Admitting: Emergency Medicine

## 2016-09-09 ENCOUNTER — Emergency Department
Admission: EM | Admit: 2016-09-09 | Discharge: 2016-09-10 | Disposition: A | Discharge status: Other Acute Inpatient Hospital | Payer: Medicaid Other | Attending: Emergency Medicine | Admitting: Emergency Medicine

## 2016-09-09 DIAGNOSIS — R45851 Suicidal ideations: Secondary | ICD-10-CM | POA: Diagnosis present

## 2016-09-09 DIAGNOSIS — F17210 Nicotine dependence, cigarettes, uncomplicated: Secondary | ICD-10-CM | POA: Insufficient documentation

## 2016-09-09 DIAGNOSIS — Z79899 Other long term (current) drug therapy: Secondary | ICD-10-CM | POA: Insufficient documentation

## 2016-09-09 DIAGNOSIS — F331 Major depressive disorder, recurrent, moderate: Secondary | ICD-10-CM | POA: Diagnosis not present

## 2016-09-09 DIAGNOSIS — F10120 Alcohol abuse with intoxication, uncomplicated: Secondary | ICD-10-CM | POA: Insufficient documentation

## 2016-09-09 DIAGNOSIS — F1410 Cocaine abuse, uncomplicated: Secondary | ICD-10-CM | POA: Diagnosis not present

## 2016-09-09 DIAGNOSIS — F332 Major depressive disorder, recurrent severe without psychotic features: Secondary | ICD-10-CM | POA: Diagnosis not present

## 2016-09-09 DIAGNOSIS — F1420 Cocaine dependence, uncomplicated: Secondary | ICD-10-CM

## 2016-09-09 DIAGNOSIS — F1020 Alcohol dependence, uncomplicated: Secondary | ICD-10-CM

## 2016-09-09 DIAGNOSIS — F10920 Alcohol use, unspecified with intoxication, uncomplicated: Secondary | ICD-10-CM

## 2016-09-09 LAB — COMPREHENSIVE METABOLIC PANEL
ALT: 29 U/L (ref 17–63)
AST: 36 U/L (ref 15–41)
Albumin: 4.8 g/dL (ref 3.5–5.0)
Alkaline Phosphatase: 60 U/L (ref 38–126)
Anion gap: 10 (ref 5–15)
BILIRUBIN TOTAL: 0.7 mg/dL (ref 0.3–1.2)
BUN: 7 mg/dL (ref 6–20)
CHLORIDE: 105 mmol/L (ref 101–111)
CO2: 26 mmol/L (ref 22–32)
CREATININE: 1.11 mg/dL (ref 0.61–1.24)
Calcium: 9.4 mg/dL (ref 8.9–10.3)
Glucose, Bld: 105 mg/dL — ABNORMAL HIGH (ref 65–99)
Potassium: 3.9 mmol/L (ref 3.5–5.1)
Sodium: 141 mmol/L (ref 135–145)
TOTAL PROTEIN: 8.3 g/dL — AB (ref 6.5–8.1)

## 2016-09-09 LAB — URINE DRUG SCREEN, QUALITATIVE (ARMC ONLY)
Amphetamines, Ur Screen: NOT DETECTED
BARBITURATES, UR SCREEN: NOT DETECTED
BENZODIAZEPINE, UR SCRN: NOT DETECTED
CANNABINOID 50 NG, UR ~~LOC~~: NOT DETECTED
Cocaine Metabolite,Ur ~~LOC~~: POSITIVE — AB
MDMA (Ecstasy)Ur Screen: NOT DETECTED
Methadone Scn, Ur: NOT DETECTED
Opiate, Ur Screen: NOT DETECTED
PHENCYCLIDINE (PCP) UR S: NOT DETECTED
TRICYCLIC, UR SCREEN: NOT DETECTED

## 2016-09-09 LAB — CBC
HCT: 39.2 % — ABNORMAL LOW (ref 40.0–52.0)
Hemoglobin: 13.5 g/dL (ref 13.0–18.0)
MCH: 32.8 pg (ref 26.0–34.0)
MCHC: 34.4 g/dL (ref 32.0–36.0)
MCV: 95.3 fL (ref 80.0–100.0)
PLATELETS: 127 10*3/uL — AB (ref 150–440)
RBC: 4.11 MIL/uL — ABNORMAL LOW (ref 4.40–5.90)
RDW: 13 % (ref 11.5–14.5)
WBC: 6.3 10*3/uL (ref 3.8–10.6)

## 2016-09-09 LAB — ETHANOL: ALCOHOL ETHYL (B): 214 mg/dL — AB (ref <5)

## 2016-09-09 LAB — ACETAMINOPHEN LEVEL: Acetaminophen (Tylenol), Serum: <10 ug/mL — ABNORMAL LOW (ref 10–30)

## 2016-09-09 LAB — SALICYLATE LEVEL

## 2016-09-09 NOTE — ED Provider Notes (Signed)
[facility] Emergency Department Provider Note   ____________________________________________   First MD Initiated Contact with Patient 09/09/16 2310     (approximate)  I have reviewed the triage vital signs and the nursing notes.   HISTORY  Chief Complaint Mental Health Problem    HPI Danny RodneyGlenn L Rooke Jr. is a 45 y.o. male brought to the ED via [employer]'s deputy with a chief complaint of depression with suicidal thoughts. Patient reports both his parents died in the month of November and he is feeling very upset. Admits to heavy EtOH use tonight. Denies plan for suicide but feels like he would harm himself. Denies HI/AH/VH. Voices no medical complaints.Nothing makes his symptoms better or worse. Reports last seizure 6-7 years ago.   Past Medical History:  Diagnosis Date  . Seizures (HCC)     There are no active problems to display for this patient.   Past Surgical History:  Procedure Laterality Date  . APPENDECTOMY      Prior to Admission medications   Medication Sig Start Date End Date Taking? Authorizing Provider  divalproex (DEPAKOTE ER) 500 MG 24 hr tablet Take 500 mg by mouth daily.    Historical Provider, MD    Allergies Aspirin  No family history on file.  Social History Social History  Substance Use Topics  . Smoking status: Heavy Tobacco Smoker    Packs/day: 2.00    Types: Cigarettes  . Smokeless tobacco: Never Used  . Alcohol use Yes     Comment: consumed 2 pints of "white liquor" this evening    Review of Systems  Constitutional: No fever/chills. Eyes: No visual changes. ENT: No sore throat. Cardiovascular: Denies chest pain. Respiratory: Denies shortness of breath. Gastrointestinal: No abdominal pain.  No nausea, no vomiting.  No diarrhea.  No constipation. Genitourinary: Negative for dysuria. Musculoskeletal: Negative for back pain. Skin: Negative for rash. Neurological: Negative for headaches, focal weakness or  numbness. Psychiatric:Positive for depression with suicidal ideation.  10-point ROS otherwise negative.  ____________________________________________   PHYSICAL EXAM:  VITAL SIGNS: ED Triage Vitals [09/09/16 2255]  Enc Vitals Group     BP 117/75     Pulse Rate 92     Resp 18     Temp 98.1 F (36.7 C)     Temp Source Oral     SpO2 96 %     Weight 147 lb (66.7 kg)     Height 6\' 2"  (1.88 m)     Head Circumference      Peak Flow      Pain Score      Pain Loc      Pain Edu?      Excl. in GC?     Constitutional: Alert and oriented. Well appearing and in no acute distress. Intoxicated. Eyes: Conjunctivae are normal. PERRL. EOMI. Head: Atraumatic. Nose: No congestion/rhinnorhea. Mouth/Throat: Mucous membranes are moist.  Oropharynx non-erythematous. Neck: No stridor.   Cardiovascular: Normal rate, regular rhythm. Grossly normal heart sounds.  Good peripheral circulation. Respiratory: Normal respiratory effort.  No retractions. Lungs CTAB. Gastrointestinal: Soft and nontender. No distention. No abdominal bruits. No CVA tenderness. Musculoskeletal: No lower extremity tenderness nor edema.  No joint effusions. Neurologic:  Normal speech and language. No gross focal neurologic deficits are appreciated. No gait instability. Skin:  Skin is warm, dry and intact. No rash noted. Psychiatric: Mood and affect are tearful. Speech and behavior are normal.  ____________________________________________   LABS (all labs ordered are listed, but only abnormal  results are displayed)  Labs Reviewed  COMPREHENSIVE METABOLIC PANEL - Abnormal; Notable for the following:       Result Value   Glucose, Bld 105 (*)    Total Protein 8.3 (*)    All other components within normal limits  ETHANOL - Abnormal; Notable for the following:    Alcohol, Ethyl (B) 214 (*)    All other components within normal limits  ACETAMINOPHEN LEVEL - Abnormal; Notable for the following:    Acetaminophen (Tylenol),  Serum <10 (*)    All other components within normal limits  CBC - Abnormal; Notable for the following:    RBC 4.11 (*)    HCT 39.2 (*)    Platelets 127 (*)    All other components within normal limits  URINE DRUG SCREEN, QUALITATIVE ([facility] ONLY) - Abnormal; Notable for the following:    Cocaine Metabolite,Ur [place] POSITIVE (*)    All other components within normal limits  SALICYLATE LEVEL   ____________________________________________  EKG  None ____________________________________________  RADIOLOGY  None ____________________________________________   PROCEDURES  Procedure(s) performed: None  Procedures  Critical Care performed: No  ____________________________________________   INITIAL IMPRESSION / ASSESSMENT AND PLAN / ED COURSE  Pertinent labs & imaging results that were available during my care of the patient were reviewed by me and considered in my medical decision making (see chart for details).  45 year old male who presents with severe depression with suicidal ideation. Will place patient under involuntary commitment pending TTS and psychiatry consults.  Clinical Course as of Sep 10 34  Wed Sep 09, 2016  2358 Laboratory urinalysis results notable for elevated EtOH level and positive cocaine metabolites. At this time patient is medically cleared for psychiatric evaluation and disposition. [facility] and psychiatry consults.  [JS]    Clinical Course User Index [JS] Irean HongJade J Sung, MD     ____________________________________________   FINAL CLINICAL IMPRESSION(S) / ED DIAGNOSES  Final diagnoses:  Moderate episode of recurrent major depressive disorder (HCC)  Alcoholic intoxication without complication (HCC)  Cocaine abuse      NEW MEDICATIONS STARTED DURING THIS VISIT:  New Prescriptions   No medications on file     Note:  This document was prepared using Dragon voice recognition software and may include  unintentional dictation errors.    Irean HongJade J Sung, MD 09/10/16 [phone]

## 2016-09-09 NOTE — ED Triage Notes (Addendum)
Pt comes into the Ed via [employer] where he was picked up for suicidal thoughts.  Per the Deputies the patient expressed to them that he felt like walking out in front of traffic.  Patient presents tearful in the triage room.  He states that November is the anniversary of his parents death and he is feeling very upset about it like he does every year. Patient states the only plan that was made was for him to walk out in traffic today, but denies any other plans recently. Patient has ETOH abuse on board where he consumed 2 pints of white liquor.

## 2016-09-10 ENCOUNTER — Inpatient Hospital Stay
Admission: RE | Admit: 2016-09-10 | Discharge: 2016-09-21 | DRG: 885 | Disposition: A | Discharge status: Home / Self Care | Payer: Medicaid Other | Source: Intra-hospital | Attending: Psychiatry | Admitting: Psychiatry

## 2016-09-10 DIAGNOSIS — F1020 Alcohol dependence, uncomplicated: Secondary | ICD-10-CM | POA: Diagnosis present

## 2016-09-10 DIAGNOSIS — J189 Pneumonia, unspecified organism: Secondary | ICD-10-CM

## 2016-09-10 DIAGNOSIS — Z9114 Patient's other noncompliance with medication regimen: Secondary | ICD-10-CM

## 2016-09-10 DIAGNOSIS — F1420 Cocaine dependence, uncomplicated: Secondary | ICD-10-CM | POA: Diagnosis present

## 2016-09-10 DIAGNOSIS — G40909 Epilepsy, unspecified, not intractable, without status epilepticus: Secondary | ICD-10-CM | POA: Diagnosis present

## 2016-09-10 DIAGNOSIS — R5381 Other malaise: Secondary | ICD-10-CM

## 2016-09-10 DIAGNOSIS — F332 Major depressive disorder, recurrent severe without psychotic features: Secondary | ICD-10-CM | POA: Diagnosis present

## 2016-09-10 DIAGNOSIS — R45851 Suicidal ideations: Secondary | ICD-10-CM | POA: Diagnosis present

## 2016-09-10 DIAGNOSIS — F17210 Nicotine dependence, cigarettes, uncomplicated: Secondary | ICD-10-CM | POA: Diagnosis not present

## 2016-09-10 DIAGNOSIS — R531 Weakness: Secondary | ICD-10-CM | POA: Diagnosis present

## 2016-09-10 DIAGNOSIS — F419 Anxiety disorder, unspecified: Secondary | ICD-10-CM | POA: Diagnosis present

## 2016-09-10 DIAGNOSIS — E8881 Metabolic syndrome: Secondary | ICD-10-CM | POA: Diagnosis present

## 2016-09-10 DIAGNOSIS — G4700 Insomnia, unspecified: Secondary | ICD-10-CM | POA: Diagnosis present

## 2016-09-10 DIAGNOSIS — R9431 Abnormal electrocardiogram [ECG] [EKG]: Secondary | ICD-10-CM | POA: Diagnosis present

## 2016-09-10 DIAGNOSIS — E538 Deficiency of other specified B group vitamins: Secondary | ICD-10-CM | POA: Diagnosis present

## 2016-09-10 DIAGNOSIS — Z79899 Other long term (current) drug therapy: Secondary | ICD-10-CM

## 2016-09-10 DIAGNOSIS — F17200 Nicotine dependence, unspecified, uncomplicated: Secondary | ICD-10-CM | POA: Diagnosis present

## 2016-09-10 DIAGNOSIS — F10120 Alcohol abuse with intoxication, uncomplicated: Secondary | ICD-10-CM | POA: Diagnosis not present

## 2016-09-10 DIAGNOSIS — R262 Difficulty in walking, not elsewhere classified: Secondary | ICD-10-CM

## 2016-09-10 MED ORDER — VITAMIN B-1 100 MG PO TABS
100.0000 mg | ORAL_TABLET | Freq: Every day | ORAL | Status: DC
  Filled 2016-09-10: qty 1

## 2016-09-10 MED ORDER — LORAZEPAM 2 MG/ML IJ SOLN: 1.0000 mg | Freq: Four times a day (QID) | INTRAMUSCULAR | Status: DC | PRN

## 2016-09-10 MED ORDER — FOLIC ACID 1 MG PO TABS
1.0000 mg | ORAL_TABLET | Freq: Every day | ORAL | Status: DC
  Administered 2016-09-10: 1 mg via ORAL
  Filled 2016-09-10: qty 1

## 2016-09-10 MED ORDER — THIAMINE HCL 100 MG/ML IJ SOLN: 100.0000 mg | Freq: Every day | INTRAMUSCULAR | Status: DC

## 2016-09-10 MED ORDER — CITALOPRAM HYDROBROMIDE 20 MG PO TABS
20.0000 mg | ORAL_TABLET | Freq: Every day | ORAL | Status: DC
  Administered 2016-09-10: 20 mg via ORAL
  Filled 2016-09-10: qty 1

## 2016-09-10 MED ORDER — LORAZEPAM 1 MG PO TABS: 1.0000 mg | ORAL_TABLET | Freq: Four times a day (QID) | ORAL | Status: DC | PRN

## 2016-09-10 MED ORDER — ADULT MULTIVITAMIN W/MINERALS CH
1.0000 | ORAL_TABLET | Freq: Every day | ORAL | Status: DC
  Administered 2016-09-10: 1 via ORAL
  Filled 2016-09-10: qty 1

## 2016-09-10 NOTE — ED Notes (Signed)
Patient noted in room. No complaints, stable, in no acute distress. Q15 minute rounds and monitoring via Security Cameras to continue.  

## 2016-09-10 NOTE — BH Assessment (Signed)
Patient accepted to [facility], attending Dr. Jennet MaduroPucilowska, Rm 323.

## 2016-09-10 NOTE — Consult Note (Signed)
[facility] Consult   Reason for Consult:  Consult for 45 year old man with history of alcohol abuse and depression Referring Physician:  Owens Shark Patient Identification: Danny Johnston. MRN:  [id number] Principal Diagnosis: Severe recurrent major depression without psychotic features [facility]) Diagnosis:   Patient Active Problem List   Diagnosis Date Noted  . Severe recurrent major depression without psychotic features ([place]) [F33.2] 09/10/2016  . Suicidal ideation [R45.851] 09/10/2016  . Alcohol abuse [F10.10] 09/10/2016  . Marijuana abuse [F12.10] 09/10/2016    Total Time spent with patient: 1 hour  Subjective:   Danny Johnston. is a 45 y.o. male patient admitted with "I'm just trying to get some help".  HPI:  Patient interviewed. Chart reviewed. 45 year old man presented to the emergency room intoxicated with suicidal thoughts and depression. This morning he is no longer drunk but is able to give a coherent history. He says his mood stays down and sad and depressed pretty much all the time but it gets worse this time of year. He attributes that to the fact that his mother died around the Thanksgiving holiday in 2004. Sleep is poor. Appetite is poor. Motivation is poor. Negative thinking. Having some suicidal thoughts but with only vague plans such as taking an overdose. He continues to drink heavily and on a regular basis. Says that he drinks a as much as a 12 pack a day. He is vague about whether the amount is changed recently. Also uses marijuana regularly. He has very little positive going on in his life. Little social activity.  Medical history: He says he has a history of a seizure disorder but doesn't take any medicine for it. Makes me wonder if it might of been related to his alcohol use. He says he hasn't actually had a seizure in several years.  Social history: Not married. No communication with any children. Closest relatives are her sister and some cousins. He gets  disability and doesn't work.  Substance abuse history: Long-standing alcohol abuse. Longest sobriety he can recall was about 4 months. He was here in the hospital in 2013 and went to the alcohol and drug abuse treatment center. Doesn't sound like he is engaging in recent substance abuse treatment.  Past Psychiatric History: Patient has had psychiatric admissions here and at the alcohol and drug abuse treatment facility in 2013. He was a client at [facility] but has not been following up there probably in over a year. Used to take Abilify he recalls and according to the chart was also on Celexa but he says he hasn't been on his medicine in over a year. He has had a history of suicidal thoughts and says he has overdosed in the past although he is vague about the history that. Does not report any history of violence or psychosis.  Risk to Self: Suicidal Ideation: Yes-Currently Present Suicidal Intent: Yes-Currently Present Is patient at risk for suicide?: Yes Suicidal Plan?: Yes-Currently Present Specify Current Suicidal Plan: Pt reports a plan to walk out in front of traffic Access to Means: Yes Specify Access to Suicidal Means: access to traffic What has been your use of drugs/alcohol within the last 12 months?: alcohol and cocaine use How many times?: 1 Other Self Harm Risks: none identified Triggers for Past Attempts: Anniversary (anniversary of parents' deaths) Intentional Self Injurious Behavior: None Risk to Others: Homicidal Ideation: No Thoughts of Harm to Others: No Current Homicidal Intent: No Current Homicidal Plan: No Access to Homicidal Means: No Identified Victim:  none identified History of harm to others?: No Assessment of Violence: None Noted Violent Behavior Description: none identified Does patient have access to weapons?: No Criminal Charges Pending?: No Does patient have a court date: No Prior Inpatient Therapy: Prior Inpatient Therapy: Yes Prior Therapy Dates: 2013 Prior  Therapy Facilty/Provider(s): [facility] Reason for Treatment: depression Prior Outpatient Therapy: Prior Outpatient Therapy: No Prior Therapy Dates: na Prior Therapy Facilty/Provider(s): na Reason for Treatment: na Does patient have an ACCT team?: No Does patient have Intensive In-House Services?  : No Does patient have [facility] services? : No Does patient have P4CC services?: No  Past Medical History:  Past Medical History:  Diagnosis Date  . Seizures ([place])     Past Surgical History:  Procedure Laterality Date  . APPENDECTOMY     Family History: No family history on file. Family Psychiatric  History: Does not know of any family history of mental illness Social History:  History  Alcohol Use  . Yes    Comment: consumed 2 pints of "white liquor" this evening     History  Drug use: Unknown    Social History   Social History  . Marital status: Married    Spouse name: N/A  . Number of children: N/A  . Years of education: N/A   Social History Main Topics  . Smoking status: Heavy Tobacco Smoker    Packs/day: 2.00    Types: Cigarettes  . Smokeless tobacco: Never Used  . Alcohol use Yes     Comment: consumed 2 pints of "white liquor" this evening  . Drug use: Unknown  . Sexual activity: Not Asked   Other Topics Concern  . None   Social History Narrative  . None   Additional Social History:    Allergies:   Allergies  Allergen Reactions  . Acetaminophen     Other reaction(s): Unknown  . Aspirin Swelling    Labs:  Results for orders placed or performed during the hospital encounter of 09/09/16 (from the past 48 hour(s))  Comprehensive metabolic panel     Status: Abnormal   Collection Time: 09/09/16 10:58 PM  Result Value Ref Range   Sodium 141 135 - 145 mmol/L   Potassium 3.9 3.5 - 5.1 mmol/L   Chloride 105 101 - 111 mmol/L   CO2 26 22 - 32 mmol/L   Glucose, Bld 105 (H) 65 - 99 mg/dL   BUN 7 6 - 20 mg/dL   Creatinine, Ser 1.11 0.61 - 1.24 mg/dL   Calcium  9.4 8.9 - 10.3 mg/dL   Total Protein 8.3 (H) 6.5 - 8.1 g/dL   Albumin 4.8 3.5 - 5.0 g/dL   AST 36 15 - 41 U/L   ALT 29 17 - 63 U/L   Alkaline Phosphatase 60 38 - 126 U/L   Total Bilirubin 0.7 0.3 - 1.2 mg/dL   GFR calc non Af Amer >60 >60 mL/min   GFR calc Af Amer >60 >60 mL/min    Comment: (NOTE) The eGFR has been calculated using the CKD EPI equation. This calculation has not been validated in all clinical situations. eGFR's persistently <60 mL/min signify possible Chronic Kidney Disease.    Anion gap 10 5 - 15  Ethanol     Status: Abnormal   Collection Time: 09/09/16 10:58 PM  Result Value Ref Range   Alcohol, Ethyl (B) 214 (H) <5 mg/dL    Comment:        LOWEST DETECTABLE LIMIT FOR SERUM ALCOHOL IS 5 mg/dL FOR  MEDICAL PURPOSES ONLY   Salicylate level     Status: None   Collection Time: 09/09/16 10:58 PM  Result Value Ref Range   Salicylate Lvl <8.2 2.8 - 30.0 mg/dL  Acetaminophen level     Status: Abnormal   Collection Time: 09/09/16 10:58 PM  Result Value Ref Range   Acetaminophen (Tylenol), Serum <10 (L) 10 - 30 ug/mL    Comment:        THERAPEUTIC CONCENTRATIONS VARY SIGNIFICANTLY. A RANGE OF 10-30 ug/mL MAY BE AN EFFECTIVE CONCENTRATION FOR MANY PATIENTS. HOWEVER, SOME ARE BEST TREATED AT CONCENTRATIONS OUTSIDE THIS RANGE. ACETAMINOPHEN CONCENTRATIONS >150 ug/mL AT 4 HOURS AFTER INGESTION AND >50 ug/mL AT 12 HOURS AFTER INGESTION ARE OFTEN ASSOCIATED WITH TOXIC REACTIONS.   cbc     Status: Abnormal   Collection Time: 09/09/16 10:58 PM  Result Value Ref Range   WBC 6.3 3.8 - 10.6 K/uL   RBC 4.11 (L) 4.40 - 5.90 MIL/uL   Hemoglobin 13.5 13.0 - 18.0 g/dL   HCT 39.2 (L) 40.0 - 52.0 %   MCV 95.3 80.0 - 100.0 fL   MCH 32.8 26.0 - 34.0 pg   MCHC 34.4 32.0 - 36.0 g/dL   RDW 13.0 11.5 - 14.5 %   Platelets 127 (L) 150 - 440 K/uL  Urine Drug Screen, Qualitative     Status: Abnormal   Collection Time: 09/09/16 10:58 PM  Result Value Ref Range   Tricyclic, Ur  Screen NONE DETECTED NONE DETECTED   Amphetamines, Ur Screen NONE DETECTED NONE DETECTED   MDMA (Ecstasy)Ur Screen NONE DETECTED NONE DETECTED   Cocaine Metabolite,Ur [place] POSITIVE (A) NONE DETECTED   Opiate, Ur Screen NONE DETECTED NONE DETECTED   Phencyclidine (PCP) Ur S NONE DETECTED NONE DETECTED   Cannabinoid 50 Ng, Ur [place] NONE DETECTED NONE DETECTED   Barbiturates, Ur Screen NONE DETECTED NONE DETECTED   Benzodiazepine, Ur Scrn NONE DETECTED NONE DETECTED   Methadone Scn, Ur NONE DETECTED NONE DETECTED    Comment: (NOTE) 956  Tricyclics, urine               Cutoff 1000 ng/mL 200  Amphetamines, urine             Cutoff 1000 ng/mL 300  MDMA (Ecstasy), urine           Cutoff 500 ng/mL 400  Cocaine Metabolite, urine       Cutoff 300 ng/mL 500  Opiate, urine                   Cutoff 300 ng/mL 600  Phencyclidine (PCP), urine      Cutoff 25 ng/mL 700  Cannabinoid, urine              Cutoff 50 ng/mL 800  Barbiturates, urine             Cutoff 200 ng/mL 900  Benzodiazepine, urine           Cutoff 200 ng/mL 1000 Methadone, urine                Cutoff 300 ng/mL 1100 1200 The urine drug screen provides only a preliminary, unconfirmed 1300 analytical test result and should not be used for non-medical 1400 purposes. Clinical consideration and professional judgment should 1500 be applied to any positive drug screen result due to possible 1600 interfering substances. A more specific alternate chemical method 1700 must be used in order to obtain a confirmed analytical result.  [place] /  mass spectrometry (GC/MS) is the preferred 1900 confirmatory method.     Current Facility-Administered Medications  Medication Dose Route Frequency Provider Last Rate Last Dose  . citalopram (CELEXA) tablet 20 mg  20 mg Oral Daily Gonzella Lex, MD       Current Outpatient Prescriptions  Medication Sig Dispense Refill  . ARIPiprazole (ABILIFY) 5 MG tablet Take 1 tablet by mouth daily.    .  citalopram (CELEXA) 40 MG tablet Take 1 tablet by mouth daily.    . divalproex (DEPAKOTE ER) 500 MG 24 hr tablet Take 500 mg by mouth daily.      Musculoskeletal: Strength & Muscle Tone: decreased Gait & Station: normal Patient leans: N/A  Psychiatric Specialty Exam: Physical Exam  Nursing note and vitals reviewed. Constitutional: He appears well-developed. He appears distressed.  HENT:  Head: Normocephalic and atraumatic.  Eyes: Conjunctivae are normal. Pupils are equal, round, and reactive to light.  Neck: Normal range of motion.  Cardiovascular: Normal heart sounds.   Respiratory: Effort normal.  GI: Soft.  Musculoskeletal: Normal range of motion.  Neurological: He is alert.  Skin: Skin is warm and dry.  Psychiatric: His speech is delayed. He is slowed and withdrawn. He expresses impulsivity. He exhibits a depressed mood. He expresses suicidal ideation. He exhibits abnormal recent memory and abnormal remote memory.    Review of Systems  Constitutional: Negative.   HENT: Negative.   Eyes: Negative.   Respiratory: Negative.   Cardiovascular: Negative.   Gastrointestinal: Negative.   Musculoskeletal: Negative.   Skin: Negative.   Neurological: Negative.   Psychiatric/Behavioral: Positive for depression, memory loss, substance abuse and suicidal ideas. Negative for hallucinations. The patient is nervous/anxious and has insomnia.     Blood pressure 118/65, pulse 64, temperature 98 F (36.7 C), temperature source Oral, resp. rate 20, height _0  (1.88 m), weight 66.7 kg (147 lb), SpO2 100 %.Body mass index is 18.87 kg/m.  General Appearance: Disheveled  Eye Contact:  Minimal  Speech:  Slow  Volume:  Decreased  Mood:  Depressed and Dysphoric  Affect:  Depressed  Thought Process:  Goal Directed  Orientation:  Full (Time, Place, and Person)  Thought Content:  Logical and Rumination  Suicidal Thoughts:  Yes.  with intent/plan  Homicidal Thoughts:  No  Memory:  Immediate;    Good Recent;   Poor Remote;   Fair  Judgement:  Impaired  Insight:  Shallow  Psychomotor Activity:  Decreased  Concentration:  Concentration: Poor  Recall:  [employer] of Knowledge:  Fair  Language:  Fair  Akathisia:  No  Handed:  Right  AIMS (if indicated):     Assets:  Desire for Improvement Financial Resources/Insurance  ADL's:  Intact  Cognition:  Impaired,  Mild  Sleep:        Treatment Plan Summary: Daily contact with patient to assess and evaluate symptoms and progress in treatment, Medication management and Plan 45 year old man with alcohol abuse and depression who reports suicidal ideation. Affect very depressed and dysphoric. Was intoxicated yesterday but sober today. Doesn't look too shaky and his vitals are normal. We can provide detox protocol but he may not need much of it. I'm restarting his citalopram that he had taken before. He will be admitted to the psychiatric unit. Continue IVC for now. 15 minute checks. Engage in appropriate group and individual therapy.  Disposition: Recommend psychiatric Inpatient admission when medically cleared. Supportive therapy provided about ongoing stressors.  Alethia Berthold, MD 09/10/2016 12:23 PM

## 2016-09-10 NOTE — ED Notes (Signed)
[facility], He remains with very flat affect and withdrawn and minimal verbal responses,he is agreeable to plan of admission, A cousin called earlier and he refused to give her any info or code #

## 2016-09-10 NOTE — BH Assessment (Signed)
Assessment Note  Danny RodneyGlenn L Kidd Jr. is an 45 y.o. male [facility] after being picked up for voicing suicidal thoughts.  Patient tearful during assessment and reports always getting depressed around this time of the year.  He states that his mother died Thanksgiving 2003 and his father dies Thanksgiving 2004.  He says that his sister is ill as well and he is concerned about her passing away.  He says that he had thoughts of walking out in front of traffic and getting hit by a car.  He denies intent right now.   Patient admits to alcohol and cocaine use.  BAC was 214. Patient reports a previous hospitalization in 2013.   Diagnosis: Depression; Drug and alcohol use  Past Medical History:  Past Medical History:  Diagnosis Date  . Seizures ([facility])     Past Surgical History:  Procedure Laterality Date  . APPENDECTOMY      Family History: No family history on file.  Social History:  reports that he has been smoking Cigarettes.  He has been smoking about 2.00 packs per day. He has never used smokeless tobacco. He reports that he drinks alcohol. His drug history is not on file.  Additional Social History:  Alcohol / Drug Use History of alcohol / drug use?: Yes (Patient reports a history of alcohol and cocaine use.) Negative Consequences of Use: Personal relationships  CIWA: CIWA-Ar BP: 117/75 Pulse Rate: 92 COWS:    Allergies:  Allergies  Allergen Reactions  . Aspirin Swelling    Home Medications:  (Not in a hospital admission)  OB/GYN Status:  No LMP for male patient.  General Assessment Data [facility] Assessment: In system Is this a Tele or Face-to-Face Assessment?: Face-to-Face Is this an Initial Assessment or a Re-assessment for this encounter?: Initial Assessment Marital status: Single Maiden name: n/a Is patient pregnant?: No Pregnancy Status: No Living Arrangements: Other relatives Can pt return to current  living arrangement?: Yes Admission Status: Voluntary Is patient capable of signing voluntary admission?: Yes Referral Source: Self/Family/Friend Insurance type: Medicaid     Crisis Care Plan Living Arrangements: Other relatives Legal Guardian: Other: (self) Name of Psychiatrist: none reported Name of Therapist: none reported  Education Status Is patient currently in school?: No Current Grade: n/a Highest grade of school patient has completed: 12 Name of school: na Contact person: na  Risk to self with the past 6 months Suicidal Ideation: Yes-Currently Present Has patient been a risk to self within the past 6 months prior to admission? : No Suicidal Intent: Yes-Currently Present Has patient had any suicidal intent within the past 6 months prior to admission? : No Is patient at risk for suicide?: Yes Suicidal Plan?: Yes-Currently Present Has patient had any suicidal plan within the past 6 months prior to admission? : No Specify Current Suicidal Plan: Pt reports a plan to walk out in front of traffic Access to Means: Yes Specify Access to Suicidal Means: access to traffic What has been your use of drugs/alcohol within the last 12 months?: alcohol and cocaine use Previous Attempts/Gestures: Yes How many times?: 1 Other Self Harm Risks: none identified Triggers for Past Attempts: Anniversary (anniversary of parents' deaths) Intentional Self Injurious Behavior: None Family Suicide History: No Recent stressful life event(s): Loss (Comment), Trauma (Comment) Persecutory voices/beliefs?: No Depression: Yes Depression Symptoms: Loss of interest in usual pleasures, Feeling worthless/self pity, Feeling angry/irritable, Tearfulness Substance abuse history and/or treatment for substance abuse?:  Yes Suicide prevention information given to non-admitted patients: Not applicable  Risk to Others within the past 6 months Homicidal Ideation: No Does patient have any lifetime risk of  violence toward others beyond the six months prior to admission? : No Thoughts of Harm to Others: No Current Homicidal Intent: No Current Homicidal Plan: No Access to Homicidal Means: No Identified Victim: none identified History of harm to others?: No Assessment of Violence: None Noted Violent Behavior Description: none identified Does patient have access to weapons?: No Criminal Charges Pending?: No Does patient have a court date: No Is patient on probation?: No  Psychosis Hallucinations: None noted Delusions: None noted  Mental Status Report Appearance/Hygiene: In scrubs Eye Contact: Fair Motor Activity: Freedom of movement Speech: Logical/coherent Level of Consciousness: Crying Affect: Depressed, Sad Anxiety Level: Minimal Thought Processes: Coherent, Relevant Judgement: Partial Orientation: Person, Place, Time, Situation Obsessive Compulsive Thoughts/Behaviors: None  Cognitive Functioning Concentration: Good Memory: Recent Intact, Remote Intact IQ: Average Insight: Fair Impulse Control: Fair Appetite: Fair Weight Loss: 0 Weight Gain: 0 Sleep: Decreased Total Hours of Sleep: 4 Vegetative Symptoms: None  ADLScreening [facility] Assessment Services) Patient's cognitive ability adequate to safely complete daily activities?: Yes Patient able to express need for assistance with ADLs?: Yes Independently performs ADLs?: Yes (appropriate for developmental age)  Prior Inpatient Therapy Prior Inpatient Therapy: Yes Prior Therapy Dates: 2013 [facility] Reason for Treatment: depression  Prior Outpatient Therapy Prior Outpatient Therapy: No Prior Therapy Dates: na Prior Therapy Facilty/Provider(s): na Reason for Treatment: na Does patient have an ACCT team?: No Does patient have Intensive In-House Services?  : No Does patient have [facility] services? : No Does patient have P4CC services?: No  ADL Screening (condition at time of  admission) Patient's cognitive ability adequate to safely complete daily activities?: Yes Patient able to express need for assistance with ADLs?: Yes Independently performs ADLs?: Yes (appropriate for developmental age)       Abuse/Neglect Assessment (Assessment to be complete while patient is alone) Physical Abuse: Denies Verbal Abuse: Denies Sexual Abuse: Denies Exploitation of patient/patient's resources: Denies Self-Neglect: Denies Values / Beliefs Cultural Requests During Hospitalization: None Spiritual Requests During Hospitalization: None Consults Spiritual Care Consult Needed: No Social Work Consult Needed: No Merchant navy officerAdvance Directives (For Healthcare) Does patient have an advance directive?: No Would patient like information on creating an advanced directive?: No - patient declined information    Additional Information 1:1 In Past 12 Months?: No CIRT Risk: No Elopement Risk: No Does patient have medical clearance?: Yes     Disposition:  Disposition Initial Assessment Completed for this Encounter: Yes Disposition of Patient: Other dispositions Other disposition(s): Other (Comment) (Pending Psych MD consult)  On Site Evaluation by:   Reviewed with Physician:    Artist Beachoxana C Kavi Almquist 09/10/2016 12:34 AM

## 2016-09-10 NOTE — ED Notes (Signed)
Pt remains asleep,he was up all night and he just wants to sleep, did not eat breakfast and appears in no distress

## 2016-09-10 NOTE — BH Assessment (Signed)
Patient is to be admitted to [facility] by Dr. Clapacs.  Attending Physician will be Dr. Pucilowska.   Patient has been assigned to room 324, by [facility] Charge Nurse Jennifer.   Intake Paper Work has been signed and placed on patient chart.  ER staff is aware of the admission ( [facility]). 

## 2016-09-10 NOTE — ED Notes (Signed)
Report called to Baird Lyonsasey, Charity fundraiserN.  Preparing patient for transfer to [facility]U.

## 2016-09-10 NOTE — ED Notes (Signed)
Patient affect is flat and his mood is sad and depressed.  Patient is withdrawn.  Patient in bed with blankets over his head.

## 2016-09-11 DIAGNOSIS — F17200 Nicotine dependence, unspecified, uncomplicated: Secondary | ICD-10-CM | POA: Diagnosis present

## 2016-09-11 DIAGNOSIS — G40909 Epilepsy, unspecified, not intractable, without status epilepticus: Secondary | ICD-10-CM

## 2016-09-11 LAB — TSH: TSH: 0.684 u[IU]/mL (ref 0.350–4.500)

## 2016-09-11 MED ORDER — CITALOPRAM HYDROBROMIDE 20 MG PO TABS
20.0000 mg | ORAL_TABLET | Freq: Every day | ORAL | Status: DC
  Administered 2016-09-11: 20 mg via ORAL
  Filled 2016-09-11: qty 1

## 2016-09-11 MED ORDER — THIAMINE HCL 100 MG/ML IJ SOLN
100.0000 mg | Freq: Every day | INTRAMUSCULAR | Status: DC
  Filled 2016-09-11: qty 2

## 2016-09-11 MED ORDER — PNEUMOCOCCAL VAC POLYVALENT 25 MCG/0.5ML IJ INJ
0.5000 mL | INJECTION | INTRAMUSCULAR | Status: AC
  Administered 2016-09-12: 0.5 mL via INTRAMUSCULAR
  Filled 2016-09-11: qty 0.5

## 2016-09-11 MED ORDER — ADULT MULTIVITAMIN W/MINERALS CH
1.0000 | ORAL_TABLET | Freq: Every day | ORAL | Status: DC
  Administered 2016-09-11 – 2016-09-21 (×11): 1 via ORAL
  Filled 2016-09-11 (×11): qty 1

## 2016-09-11 MED ORDER — CHLORDIAZEPOXIDE HCL 25 MG PO CAPS
25.0000 mg | ORAL_CAPSULE | Freq: Four times a day (QID) | ORAL | Status: AC
  Administered 2016-09-11 – 2016-09-13 (×12): 25 mg via ORAL
  Filled 2016-09-11 (×12): qty 1

## 2016-09-11 MED ORDER — FOLIC ACID 1 MG PO TABS
1.0000 mg | ORAL_TABLET | Freq: Every day | ORAL | Status: DC
  Administered 2016-09-11 – 2016-09-21 (×11): 1 mg via ORAL
  Filled 2016-09-11 (×11): qty 1

## 2016-09-11 MED ORDER — ALUM & MAG HYDROXIDE-SIMETH 200-200-20 MG/5ML PO SUSP: 30.0000 mL | ORAL | Status: DC | PRN

## 2016-09-11 MED ORDER — VITAMIN B-1 100 MG PO TABS
100.0000 mg | ORAL_TABLET | Freq: Every day | ORAL | Status: DC
  Administered 2016-09-11 – 2016-09-21 (×11): 100 mg via ORAL
  Filled 2016-09-11 (×11): qty 1

## 2016-09-11 MED ORDER — FLUOXETINE HCL 20 MG PO CAPS
20.0000 mg | ORAL_CAPSULE | Freq: Every day | ORAL | Status: DC
  Administered 2016-09-11 – 2016-09-15 (×5): 20 mg via ORAL
  Filled 2016-09-11 (×5): qty 1

## 2016-09-11 MED ORDER — DIVALPROEX SODIUM 500 MG PO DR TAB
500.0000 mg | DELAYED_RELEASE_TABLET | Freq: Three times a day (TID) | ORAL | Status: DC
  Administered 2016-09-11 – 2016-09-14 (×10): 500 mg via ORAL
  Filled 2016-09-11 (×10): qty 1

## 2016-09-11 MED ORDER — LORAZEPAM 2 MG/ML IJ SOLN: 1.0000 mg | Freq: Four times a day (QID) | INTRAMUSCULAR | Status: DC | PRN

## 2016-09-11 MED ORDER — TRAZODONE HCL 100 MG PO TABS
100.0000 mg | ORAL_TABLET | Freq: Every day | ORAL | Status: DC
  Administered 2016-09-11 – 2016-09-20 (×9): 100 mg via ORAL
  Filled 2016-09-11 (×9): qty 1

## 2016-09-11 MED ORDER — NICOTINE 21 MG/24HR TD PT24
21.0000 mg | MEDICATED_PATCH | Freq: Every day | TRANSDERMAL | Status: DC
  Filled 2016-09-11 (×6): qty 1

## 2016-09-11 MED ORDER — MAGNESIUM HYDROXIDE 400 MG/5ML PO SUSP: 30.0000 mL | Freq: Every day | ORAL | Status: DC | PRN

## 2016-09-11 MED ORDER — LORAZEPAM 1 MG PO TABS: 1.0000 mg | ORAL_TABLET | Freq: Four times a day (QID) | ORAL | Status: DC | PRN

## 2016-09-11 NOTE — Plan of Care (Signed)
Problem: Medication: Goal: Compliance with prescribed medication regimen will improve Outcome: Progressing Patient medication compliant   

## 2016-09-11 NOTE — B[facility] Counselor (Signed)
[name] went to patient's room and attempted to wake him to complete psychosocial assessment. Patient would not wake up after multiple prompts from [name] and is unable to complete assessment at this time. [name] will attempt to complete assessment tomorrow.   Danny Johnston G. Garnette CzechSampson MSW, [facility]SWA 09/11/2016 2:47 PM

## 2016-09-11 NOTE — Progress Notes (Signed)
Recreation Therapy Notes  Date: 11.10.17 Time: 1:00 pm Location: Craft Room  Group Topic: Social Skills  Goal Area(s) Addresses:  Patient will participate in healthy coping skill. Patient will verbalize benefit of using art as a coping skill.  Behavioral Response: Inattentive  Intervention: Glass blower/designeripe Cleaner Tower  Activity: Patients were given 15 pipe cleaners and were instructed to build a free standing tower. Patients were given 2 minutes to strategize. After about 5 minutes of building, patients were instructed to put their dominant hand behind their backs and continue building.  Education: LRT educated patients on healthy support systems.  Education Outcome: In group clarification offered  Clinical Observations/Feedback: Patient did not participate in activity. Patient did not contribute to group discussion.  Jacquelynn CreeGreene,Adham Johnson M, LRT/CTRS 09/11/2016 2:59 PM

## 2016-09-11 NOTE — Tx Team (Signed)
Initial Treatment Plan 09/11/2016 12:33 AM Danny RodneyGlenn L Babic Jr. ZOX:[id number]    PATIENT STRESSORS: Medication change or noncompliance Substance abuse   PATIENT STRENGTHS: General fund of knowledge Motivation for treatment/growth   PATIENT IDENTIFIED PROBLEMS: "I'm tired of feeling this way."  Suicidal Ideations   Substance abuse                  DISCHARGE CRITERIA:  Improved stabilization in mood, thinking, and/or behavior  PRELIMINARY DISCHARGE PLAN: Outpatient therapy  PATIENT/FAMILY INVOLVEMENT: This treatment plan has been presented to and reviewed with the patient, Danny RodneyGlenn L Eichler Jr., and/or family member.  The patient and family have been given the opportunity to ask questions and make suggestions.  [name], RN 09/11/2016, 12:33 AM

## 2016-09-11 NOTE — Progress Notes (Signed)
Pt presents with sad, flat affect. Pt with passive SI, but contracts for safety. Pt reports it is the anniversary of his parents death. Pt denies HI, AVH. Isolates to self and room. Pt extremely drowsy most of shift and only came out for meals or meds. Encouragement and support offered. Medications given as prescribed. CIWA scores low. No prn needed. Will continue to assess and monitor with q 15 min checks.

## 2016-09-11 NOTE — [facility] Suicide Risk Assessment (Signed)
[facility]   Nursing information obtained from:  Patient Demographic factors:  Male, Divorced or widowed, Low socioeconomic status, Unemployed Current Mental Status:  Suicidal ideation indicated by patient, Self-harm thoughts Loss Factors:  NA Historical Factors:  Anniversary of important loss Risk Reduction Factors:  Living with another person, especially a relative  Total Time spent with patient: 1 hour Principal Problem: Severe recurrent major depression without psychotic features (HCC) Diagnosis:   Patient Active Problem List   Diagnosis Date Noted  . Tobacco use disorder [F17.200] 09/11/2016  . Seizure disorder (HCC) [G40.909] 09/11/2016  . Severe recurrent major depression without psychotic features (HCC) [F33.2] 09/10/2016  . Suicidal ideation [R45.851] 09/10/2016  . Alcohol use disorder, moderate, dependence (HCC) [F10.20] 09/10/2016  . Cocaine use disorder, moderate, dependence (HCC) [F14.20] 09/10/2016   Subjective Data: Suicidal ideation, substance abuse.  Continued Clinical Symptoms:  Alcohol Use Disorder Identification Test Final Score (AUDIT): 20 The "Alcohol Use Disorders Identification Test", Guidelines for Use in Primary Care, Second Edition.  World Science writerHealth Organization [facility](WHO). Score between 0-7:  no or low risk or alcohol related problems. Score between 8-15:  moderate risk of alcohol related problems. Score between 16-19:  high risk of alcohol related problems. Score 20 or above:  warrants further diagnostic evaluation for alcohol dependence and treatment.   CLINICAL FACTORS:   Depression:   Comorbid alcohol abuse/dependence Impulsivity Alcohol/Substance Abuse/Dependencies Epilepsy   Musculoskeletal: Strength & Muscle Tone: within normal limits Gait & Station: normal Patient leans: N/A  Psychiatric Specialty Exam: Physical Exam  Nursing note and vitals reviewed.   Review of Systems  Neurological: Positive for seizures.   Psychiatric/Behavioral: Positive for depression, substance abuse and suicidal ideas.  All other systems reviewed and are negative.   Blood pressure 117/71, pulse (!) 55, temperature 98.1 F (36.7 C), resp. rate 16, height 6\' 3"  (1.905 m), weight 66.7 kg (147 lb), SpO2 100 %.Body mass index is 18.37 kg/m.  General Appearance: Disheveled  Eye Contact:  Minimal  Speech:  Slow  Volume:  Decreased  Mood:  Depressed, Hopeless and Worthless  Affect:  Blunt  Thought Process:  Goal Directed and Descriptions of Associations: Intact  Orientation:  Full (Time, Place, and Person)  Thought Content:  WDL  Suicidal Thoughts:  Yes.  with intent/plan  Homicidal Thoughts:  No  Memory:  Immediate;   Fair Recent;   Fair Remote;   Fair  Judgement:  Poor  Insight:  Lacking  Psychomotor Activity:  Psychomotor Retardation  Concentration:  Concentration: Fair and Attention Span: Fair  Recall:  [employer]  Fund of Knowledge:  Fair  Language:  Fair  Akathisia:  No  Handed:  Right  AIMS (if indicated):     Assets:  Communication Skills Desire for Improvement Financial Resources/Insurance Housing Physical Health Resilience Social Support  ADL's:  Intact  Cognition:  WNL  Sleep:  Number of Hours: 4.75      COGNITIVE FEATURES THAT CONTRIBUTE TO RISK:  None    SUICIDE RISK:   Moderate:  Frequent suicidal ideation with limited intensity, and duration, some specificity in terms of plans, no associated intent, good self-control, limited dysphoria/symptomatology, some risk factors present, and identifiable protective factors, including available and accessible social support.   PLAN OF CARE: Hospital admission, medication management, and substance abuse counseling, discharge planning.  Danny Johnston is a 45 year old male with a history of depression, mood instability, and substance use admitted for suicidal ideation in the context of treatment noncompliance, substance abuse, and approaching  anniversary of  his parents' death.  1. Suicidal ideation. The patient is able to contract for safety in the hospital.  2. Mood. In the past the patient responded well to a combination of Prozac and Abilify. We will start Prozac 20 mg.  3. Alcohol detox. He is on Librium taper. Ativan is available per CIWA scale.  4. Seizure disorder. He's been off medications. We will restart Depakote 500 mg tid.  5. Insomnia. Trazodone is available.  6. Smoking. Nicotine patch is available.  7. Substance abuse treatment. The patient is somewhat interested in treatment. He will meet with a social worker to discuss his options.  8. Metabolic syndrome monitoring. Lipid panel, TSH, and hemoglobin A1c are pending.  9. EKG. Sinus bradycardia.  10. Disposition. He will be discharged to home with his cousin. He will follow up with [facility].  I certify that inpatient services furnished can reasonably be expected to improve the patient's condition.  Kristine LineaJolanta Pucilowska, MD 09/11/2016, 8:38 AM

## 2016-09-11 NOTE — H&P (Signed)
Psychiatric Admission Assessment Adult  Patient Identification: Danny RodneyGlenn L Penna Jr. MRN:  [id number] Date of Evaluation:  09/11/2016 Chief Complaint:  Major Depression Disorder Principal Diagnosis: Severe recurrent major depression without psychotic features [facility](HCC) Diagnosis:   Patient Active Problem List   Diagnosis Date Noted  . Tobacco use disorder [F17.200] 09/11/2016  . Seizure disorder (HCC) [G40.909] 09/11/2016  . Severe recurrent major depression without psychotic features (HCC) [F33.2] 09/10/2016  . Suicidal ideation [R45.851] 09/10/2016  . Alcohol use disorder, moderate, dependence (HCC) [F10.20] 09/10/2016  . Cocaine use disorder, moderate, dependence (HCC) [F14.20] 09/10/2016   History of Present Illness:   Identifying data. Mr. Danny SellerLloyd is a 45 year old male with a history of depression, mood instability, and substance abuse.  Chief complaint. "Suicidal ideation."  History of present illness. Information was obtained from the patient and the chart. The patient has a long history of substance abuse and depression. His drinking escalated recently his been using cocaine heavily. He believes that this is due to worsening of depression as the holidays are coming and he always gets depressed missing his deceased parents. He does not have any family that he is close to. He finds holidays difficult to manage. He reports poor sleep, decreased appetite, anhedonia, feeling of guilt and hopelessness worthlessness, poor energy and concentration, social isolation, and crying status. He recently developed suicidal thinking with a plan to cut himself or overdose. His drinking escalated. Blood alcohol level on admission was 214. He's been also using cocaine. He has not been compliant with medications and has not been seen mental health professionals lately. He does not participate in any substance abuse treatment. He denies psychotic symptoms or symptoms suggestive of PTSD or OCD. There are no symptoms  suggestive of bipolar mania.  Past psychiatric history. There is a long history of substance abuse. He was admitted to a rehab facility in [place] many years ago. His longest period of sobriety was 8 months, also a long time ago. He uses alcohol and cocaine. In the past he was treated with success with a combination of Prozac and Abilify. He is interested in restarting medicines. He reports one suicide attempt by cutting his wrists.  Family psychiatric history. Nonreported.  Social history. He is disabled from seizures but has not been taking Depakote in a while. His last seizure episode was reported years ago. When on Depakote he is seizure free. He lives with his cousin in [place]. He has health insurance.  Total Time spent with patient: 1 hour  Is the patient at risk to self? Yes.    Has the patient been a risk to self in the past 6 months? No.  Has the patient been a risk to self within the distant past? Yes.    Is the patient a risk to others? No.  Has the patient been a risk to others in the past 6 months? No.  Has the patient been a risk to others within the distant past? No.   Prior Inpatient Therapy:   Prior Outpatient Therapy:    Alcohol Screening: 1. How often do you have a drink containing alcohol?: 4 or more times a week 2. How many drinks containing alcohol do you have on a typical day when you are drinking?: 10 or more 3. How often do you have six or more drinks on one occasion?: Daily or almost daily Preliminary Score: 8 5. How often during the last year have you failed to do what was normally expected from you becasue of drinking?:  Daily or almost daily 6. How often during the last year have you needed a first drink in the morning to get yourself going after a heavy drinking session?: Daily or almost daily 7. How often during the last year have you had a feeling of guilt of remorse after drinking?: Never 8. How often during the last year have you been unable to remember what  happened the night before because you had been drinking?: Never 9. Have you or someone else been injured as a result of your drinking?: No 10. Has a relative or friend or a doctor or another health worker been concerned about your drinking or suggested you cut down?: No Alcohol Use Disorder Identification Test Final Score (AUDIT): 20 Brief Intervention: Patient declined brief intervention Substance Abuse History in the last 12 months:  Yes.   Consequences of Substance Abuse: Negative Previous Psychotropic Medications: Yes  Psychological Evaluations: No  Past Medical History:  Past Medical History:  Diagnosis Date  . Seizures (HCC)     Past Surgical History:  Procedure Laterality Date  . APPENDECTOMY     Family History: History reviewed. No pertinent family history.  Tobacco Screening: Have you used any form of tobacco in the last 30 days? (Cigarettes, Smokeless Tobacco, Cigars, and/or Pipes): Yes Tobacco use, Select all that apply: 5 or more cigarettes per day Are you interested in Tobacco Cessation Medications?: No, patient refused Counseled patient on smoking cessation including recognizing danger situations, developing coping skills and basic information about quitting provided: Refused/Declined practical counseling Social History:  History  Alcohol Use  . Yes    Comment: consumed 2 pints of "white liquor" this evening     History  Drug Use  . Types: "Crack" cocaine    Additional Social History:                           Allergies:   Allergies  Allergen Reactions  . Acetaminophen     Other reaction(s): Unknown  . Aspirin Swelling   Lab Results:  Results for orders placed or performed during the hospital encounter of 09/10/16 (from the past 48 hour(s))  TSH     Status: None   Collection Time: 09/11/16  6:46 AM  Result Value Ref Range   TSH 0.684 0.350 - 4.500 uIU/mL    Comment: Performed by a 3rd Generation assay with a functional sensitivity of <=0.01  uIU/mL.    Blood Alcohol level:  Lab Results  Component Value Date   ETH 214 (H) 09/09/2016    Metabolic Disorder Labs:  No results found for: HGBA1C, MPG No results found for: PROLACTIN No results found for: CHOL, TRIG, HDL, CHOLHDL, VLDL, LDLCALC  Current Medications: Current Facility-Administered Medications  Medication Dose Route Frequency Provider Last Rate Last Dose  . alum & mag hydroxide-simeth (MAALOX/MYLANTA) 200-200-20 MG/5ML suspension 30 mL  30 mL Oral Q4H PRN Audery AmelJohn T Clapacs, MD      . chlordiazePOXIDE (LIBRIUM) capsule 25 mg  25 mg Oral QID Raequon Catanzaro B Alahna Dunne, MD      . divalproex (DEPAKOTE) DR tablet 500 mg  500 mg Oral Q8H Navada Osterhout B Davin Archuletta, MD      . FLUoxetine (PROZAC) capsule 20 mg  20 mg Oral Daily Raziel Koenigs B Shiana Rappleye, MD      . folic acid (FOLVITE) tablet 1 mg  1 mg Oral Daily Audery AmelJohn T Clapacs, MD   1 mg at 09/11/16 0837  . LORazepam (ATIVAN) tablet 1 mg  1 mg Oral Q6H PRN Audery Amel, MD       Or  . LORazepam (ATIVAN) injection 1 mg  1 mg Intravenous Q6H PRN Audery Amel, MD      . magnesium hydroxide (MILK OF MAGNESIA) suspension 30 mL  30 mL Oral Daily PRN Audery Amel, MD      . multivitamin with minerals tablet 1 tablet  1 tablet Oral Daily Audery Amel, MD   1 tablet at 09/11/16 1610  . nicotine (NICODERM CQ - dosed in mg/24 hours) patch 21 mg  21 mg Transdermal Daily Jancie Kercher B Morgyn Marut, MD      . Melene Muller ON 09/12/2016] pneumococcal 23 valent vaccine (PNU-IMMUNE) injection 0.5 mL  0.5 mL Intramuscular Tomorrow-1000 Johnwilliam Shepperson B Jaxon Mynhier, MD      . thiamine (VITAMIN B-1) tablet 100 mg  100 mg Oral Daily Audery Amel, MD   100 mg at 09/11/16 9604   Or  . thiamine (B-1) injection 100 mg  100 mg Intravenous Daily Audery Amel, MD      . traZODone (DESYREL) tablet 100 mg  100 mg Oral QHS Kmya Placide B Tee Richeson, MD       PTA Medications: Prescriptions Prior to Admission  Medication Sig Dispense Refill Last Dose  . ARIPiprazole (ABILIFY) 5 MG  tablet Take 1 tablet by mouth daily.     . citalopram (CELEXA) 40 MG tablet Take 1 tablet by mouth daily.     . divalproex (DEPAKOTE ER) 500 MG 24 hr tablet Take 500 mg by mouth daily.       Musculoskeletal: Strength & Muscle Tone: within normal limits Gait & Station: normal Patient leans: N/A  Psychiatric Specialty Exam: I reviewed physical examination performed in the emergency room and agree with findings. Physical Exam  Nursing note and vitals reviewed.   Review of Systems  Neurological: Positive for seizures.  Psychiatric/Behavioral: Positive for depression, substance abuse and suicidal ideas.  All other systems reviewed and are negative.   Blood pressure 117/71, pulse (!) 55, temperature 98.1 F (36.7 C), resp. rate 16, height 6\' 3"  (1.905 m), weight 66.7 kg (147 lb), SpO2 100 %.Body mass index is 18.37 kg/m.  See SRA.                                                  Sleep:  Number of Hours: 4.75    Treatment Plan Summary: Daily contact with patient to assess and evaluate symptoms and progress in treatment and Medication management   Mr. Mozingo is a 45 year old male with a history of depression, mood instability, and substance use admitted for suicidal ideation in the context of treatment noncompliance, substance abuse, and approaching anniversary of his parents' death.  1. Suicidal ideation. The patient is able to contract for safety in the hospital.  2. Mood. In the past the patient responded well to a combination of Prozac and Abilify. We will start Prozac 20 mg.  3. Alcohol detox. He is on Librium taper. Ativan is available per CIWA scale.  4. Seizure disorder. He's been off medications. We will restart Depakote 500 mg tid.  5. Insomnia. Trazodone is available.  6. Smoking. Nicotine patch is available.  7. Substance abuse treatment. The patient is somewhat interested in treatment. He will meet with a social worker to discuss his  options.  8.  Metabolic syndrome monitoring. Lipid panel, TSH, and hemoglobin A1c are pending.  9. EKG. Sinus bradycardia.  10. Disposition. He will be discharged to home with his cousin. He will follow up with [facility].   Observation Level/Precautions:  15 minute checks  Laboratory:  CBC Chemistry Profile UDS UA  Psychotherapy:    Medications:    Consultations:    Discharge Concerns:    Estimated LOS:  Other:     Physician Treatment Plan for Primary Diagnosis: Severe recurrent major depression without psychotic features (HCC) Long Term Goal(s): Improvement in symptoms so as ready for discharge  Short Term Goals: Ability to identify changes in lifestyle to reduce recurrence of condition will improve, Ability to verbalize feelings will improve, Ability to disclose and discuss suicidal ideas, Ability to demonstrate self-control will improve, Ability to identify and develop effective coping behaviors will improve, Ability to maintain clinical measurements within normal limits will improve and Compliance with prescribed medications will improve  Physician Treatment Plan for Secondary Diagnosis: Principal Problem:   Severe recurrent major depression without psychotic features (HCC) Active Problems:   Suicidal ideation   Alcohol use disorder, moderate, dependence (HCC)   Cocaine use disorder, moderate, dependence (HCC)   Tobacco use disorder   Seizure disorder (HCC)  Long Term Goal(s): Improvement in symptoms so as ready for discharge  Short Term Goals: Ability to identify changes in lifestyle to reduce recurrence of condition will improve, Ability to demonstrate self-control will improve and Ability to identify triggers associated with substance abuse/mental health issues will improve  I certify that inpatient services furnished can reasonably be expected to improve the patient's condition.    Kristine Linea, MD 11/10/20178:48 AM

## 2016-09-11 NOTE — [facility] Group Notes (Signed)
[facility] LCSW Group Therapy  09/11/2016 2:03 PM  Type of Therapy:  Group Therapy  Participation Level:  Patient attended group but did not participate during group.   Participation Quality:  Drowsy  Affect:  Lethargic  Cognitive:  Lacking  Insight:  None  Engagement in Therapy:  None  Modes of Intervention:  Activity, Discussion, Education and Support  Summary of Progress/Problems:Feelings around Relapse. Group members discussed the meaning of relapse and shared personal stories of relapse, how it affected them and others, and how they perceived themselves during this time. Group members were encouraged to identify triggers, warning signs and coping skills used when facing the possibility of relapse. Social supports were discussed and explored in detail. Patients also discussed facing disappointment and how that can trigger someone to relapse.  Cecil Vandyke G. Garnette CzechSampson MSW, LCSWA 09/11/2016, 2:04 PM

## 2016-09-11 NOTE — Progress Notes (Signed)
Patient ID: Danny RodneyGlenn L Schepers Jr., male   DOB: May 28, 1971, 45 y.o.   MRN: [id number] [facility] stating he's tired of feeling this way. States he is having thoughts of hurting himself but contracts for safety. Denies HI/AVH. Patient states he has not been taking his medication because he hasn't been able to get to it. Affect is sad and flat. Patient states his mother died Thanksgiving 2004 and father died Thanksgiving 2005. Skin search done with [facility]. No contraband found. Patient oriented to unit. Safety maintained with 15 min checks.

## 2016-09-12 DIAGNOSIS — F332 Major depressive disorder, recurrent severe without psychotic features: Principal | ICD-10-CM

## 2016-09-12 DIAGNOSIS — Z79899 Other long term (current) drug therapy: Secondary | ICD-10-CM

## 2016-09-12 DIAGNOSIS — F17210 Nicotine dependence, cigarettes, uncomplicated: Secondary | ICD-10-CM

## 2016-09-12 DIAGNOSIS — R45851 Suicidal ideations: Secondary | ICD-10-CM

## 2016-09-12 NOTE — Progress Notes (Addendum)
Patient with depressed affect, slightly irritable. No SI/HI at this time. Cooperative with meals, meds and plan of care. Minimal interaction with peers. Guarded with staff, able to verbalize needs to Clinical research associatewriter. Returns to bed to rest. Safety maintained.No s/s of withdrawal.

## 2016-09-12 NOTE — Progress Notes (Signed)
Pt was observed in room lying in bed with eyes closed. Easily aroused when name was called. Alert and oriented x4, respirations even and unlabored, gait steady and unassisted with no acute distress noted. Denies having any pain or physical discomfort at this time. Denies SI/HI/AVH but does report experiencing anxiety and depression on a level of 3/10. Pt stated "i'm just depressed about life." Pt would not elaborate on this statement. CIWA complete. Score was a zero. Is medication compliant. Remains on q15 minute observation rounds for safety. Will continue to monitor.

## 2016-09-12 NOTE — Plan of Care (Signed)
Problem: Coping: Goal: Ability to cope will improve Outcome: Progressing Pt will learn positive coping mechanisms.   Problem: Health Behavior/Discharge Planning: Goal: Identification of resources available to assist in meeting health care needs will improve Outcome: Progressing Pt will utilize resources that are available to meet health care needs.  Problem: Medication: Goal: Compliance with prescribed medication regimen will improve Outcome: Progressing Pt is medication compliant.  Problem: Self-Concept: Goal: Ability to disclose and discuss suicidal ideas will improve Outcome: Progressing Pt will verbalizes thoughts of SI to staff.

## 2016-09-12 NOTE — [facility] Suicide Risk Assessment (Signed)
[facility] INPATIENT:  Family/Significant Other Suicide Prevention Education  Suicide Prevention Education:  Patient Refusal for Family/Significant Other Suicide Prevention Education: The patient Danny RodneyGlenn L Orlich Jr. has refused to provide written consent for family/significant other to be provided Family/Significant Other Suicide Prevention Education during admission and/or prior to discharge.  Physician notified.  Rechy Bost G. Garnette CzechSampson MSW, LCSWA 09/12/2016, 10:35 AM

## 2016-09-12 NOTE — Progress Notes (Signed)
[facility] MD Progress Note  09/12/2016 12:41 PM Weyman Rodney.  MRN:  [id number] Subjective:  Mr. Cruise is a 45 year old male with a history of depression, mood instability, and substance abuse. Patient was seen this morning and chart reviewed. Patient was admitted with a blood alcohol level of 214. He was also using cocaine. This morning his seatbelt was at a 0. Patient was sleeping deeply and was aroused when the patient a physician called his name. States that he became suicidal because it was his parents death anniversary for this Thanksgiving. He has a long history of substance abuse and depression. States that it was difficult for him to manage the holidays. He is able to contract for safety in the hospital and denies suicidal thoughts this morning.  Principal Problem: Severe recurrent major depression without psychotic features (HCC) Diagnosis:   Patient Active Problem List   Diagnosis Date Noted  . Tobacco use disorder [F17.200] 09/11/2016  . Seizure disorder (HCC) [G40.909] 09/11/2016  . Severe recurrent major depression without psychotic features (HCC) [F33.2] 09/10/2016  . Suicidal ideation [R45.851] 09/10/2016  . Alcohol use disorder, moderate, dependence (HCC) [F10.20] 09/10/2016  . Cocaine use disorder, moderate, dependence (HCC) [F14.20] 09/10/2016   Total Time spent with patient: 20 minutes  Past Psychiatric History: There is a long history of substance abuse. He was admitted to a rehab facility in [place] many years ago. His longest period of sobriety was 8 months, also a long time ago. He uses alcohol and cocaine. In the past he was treated with success with a combination of Prozac and Abilify. He is interested in restarting medicines. He reports one suicide attempt by cutting his wrists.  Past Medical History:  Past Medical History:  Diagnosis Date  . Seizures (HCC)     Past Surgical History:  Procedure Laterality Date  . APPENDECTOMY     Family History: History  reviewed. No pertinent family history. Family Psychiatric  History:  Social History:  History  Alcohol Use  . Yes    Comment: consumed 2 pints of "white liquor" this evening     History  Drug Use  . Types: "Crack" cocaine    Social History   Social History  . Marital status: Married    Spouse name: N/A  . Number of children: N/A  . Years of education: N/A   Social History Main Topics  . Smoking status: Heavy Tobacco Smoker    Packs/day: 2.00    Types: Cigarettes  . Smokeless tobacco: Never Used  . Alcohol use Yes     Comment: consumed 2 pints of "white liquor" this evening  . Drug use:     Types: "Crack" cocaine  . Sexual activity: Not Asked   Other Topics Concern  . None   Social History Narrative  . None   Additional Social History: He is disabled from seizures but has not been taking Depakote in a while. His last seizure episode was reported years ago. When on Depakote he is seizure free. He lives with his cousin in [place]. He has health insurance.                         Sleep: Fair  Appetite:  Fair  Current Medications: Current Facility-Administered Medications  Medication Dose Route Frequency Provider Last Rate Last Dose  . alum & mag hydroxide-simeth (MAALOX/MYLANTA) 200-200-20 MG/5ML suspension 30 mL  30 mL Oral Q4H PRN Audery Amel, MD      .  chlordiazePOXIDE (LIBRIUM) capsule 25 mg  25 mg Oral QID [name], MD   25 mg at 09/12/16 1140  . divalproex (DEPAKOTE) DR tablet 500 mg  500 mg Oral Q8H [name], MD   500 mg at 09/12/16 1141  . FLUoxetine (PROZAC) capsule 20 mg  20 mg Oral Daily [name], MD   20 mg at 09/12/16 0844  . folic acid (FOLVITE) tablet 1 mg  1 mg Oral Daily Audery AmelJohn T Clapacs, MD   1 mg at 09/12/16 0844  . LORazepam (ATIVAN) tablet 1 mg  1 mg Oral Q6H PRN Audery AmelJohn T Clapacs, MD       Or  . LORazepam (ATIVAN) injection 1 mg  1 mg Intravenous Q6H PRN Audery AmelJohn T Clapacs, MD      . magnesium hydroxide  (MILK OF MAGNESIA) suspension 30 mL  30 mL Oral Daily PRN Audery AmelJohn T Clapacs, MD      . multivitamin with minerals tablet 1 tablet  1 tablet Oral Daily Audery AmelJohn T Clapacs, MD   1 tablet at 09/12/16 0844  . nicotine (NICODERM CQ - dosed in mg/24 hours) patch 21 mg  21 mg Transdermal Daily [name], MD      . pneumococcal 23 valent vaccine (PNU-IMMUNE) injection 0.5 mL  0.5 mL Intramuscular Tomorrow-1000 [name], MD      . thiamine (VITAMIN B-1) tablet 100 mg  100 mg Oral Daily Audery AmelJohn T Clapacs, MD   100 mg at 09/12/16 0844   Or  . thiamine (B-1) injection 100 mg  100 mg Intravenous Daily Audery AmelJohn T Clapacs, MD      . traZODone (DESYREL) tablet 100 mg  100 mg Oral QHS Shari ProwsJolanta B Pucilowska, MD   100 mg at 09/11/16 2146    Lab Results:  Results for orders placed or performed during the hospital encounter of 09/10/16 (from the past 48 hour(s))  TSH     Status: None   Collection Time: 09/11/16  6:46 AM  Result Value Ref Range   TSH 0.684 0.350 - 4.500 uIU/mL    Comment: Performed by a 3rd Generation assay with a functional sensitivity of <=0.01 uIU/mL.    Blood Alcohol level:  Lab Results  Component Value Date   ETH 214 (H) 09/09/2016    Metabolic Disorder Labs: No results found for: HGBA1C, MPG No results found for: PROLACTIN No results found for: CHOL, TRIG, HDL, CHOLHDL, VLDL, LDLCALC  Physical Findings: AIMS:  , ,  ,  ,    CIWA:  CIWA-Ar Total: 0 COWS:     Musculoskeletal: Strength & Muscle Tone: within normal limits Gait & Station: normal Patient leans: N/A  Psychiatric Specialty Exam: Physical Exam  ROS  Blood pressure 114/78, pulse (!) 53, temperature 98.1 F (36.7 C), resp. rate 16, height 6\' 3"  (1.905 m), weight 147 lb (66.7 kg), SpO2 100 %.Body mass index is 18.37 kg/m.  General Appearance: Disheveled  Eye Contact:  Minimal  Speech:  Slow  Volume:  Decreased  Mood:  Depressed, Hopeless and Worthless  Affect:  Blunt  Thought Process:  Goal Directed  and Descriptions of Associations: Intact  Orientation:  Full (Time, Place, and Person)  Thought Content:  WDL  Suicidal Thoughts:  Yes.  with intent/plan  Homicidal Thoughts:  No  Memory:  Immediate;   Fair Recent;   Fair Remote;   Fair  Judgement:  Poor  Insight:  Lacking  Psychomotor Activity:  Psychomotor Retardation  Concentration:  Concentration: Fair  and Attention Span: Fair  Recall:  FiservFair  Fund of Knowledge:  Fair  Language:  Fair  Akathisia:  No  Handed:  Right  AIMS (if indicated):     Assets:  Communication Skills Desire for Improvement Financial Resources/Insurance Housing Physical Health Resilience Social Support  ADL's:  Intact  Cognition:  WNL       Sleep:  Number of Hours: 8.15     Treatment Plan Summary: Daily contact with patient to assess and evaluate symptoms and progress in treatment and Medication management   [name] is a 45 year old male with a history of depression, mood instability, and substance use admitted for suicidal ideation in the context of treatment noncompliance, substance abuse, and approaching anniversary of his parents' death.  1. Suicidal ideation. The patient is able to contract for safety in the hospital.  2. Mood. In the past the patient responded well to a combination of Prozac and Abilify. Continue Prozac 20 mg.  3. Alcohol detox. He is on Librium taper. Ativan is available per CIWA scale. CIWA score 0 this morning we'll continue to monitor for withdrawal symptoms  4. Seizure disorder. He's been off medications. We will restart Depakote 500 mg tid.  5. Insomnia. Trazodone is available.  6. Smoking. Nicotine patch is available.  7. Substance abuse treatment. The patient is somewhat interested in treatment. He will meet with a social worker to discuss his options.  8. Metabolic syndrome monitoring. Lipid panel, TSH Normal   9. EKG. Sinus bradycardia.  10. Disposition. He will be discharged to home with his  cousin. He will follow up with [facility].   Physician Treatment Plan for Primary Diagnosis: Severe recurrent major depression without psychotic features (HCC) Long Term Goal(s): Improvement in symptoms so as ready for discharge  Short Term Goals: Ability to identify changes in lifestyle to reduce recurrence of condition will improve, Ability to verbalize feelings will improve, Ability to disclose and discuss suicidal ideas, Ability to demonstrate self-control will improve, Ability to identify and develop effective coping behaviors will improve, Ability to maintain clinical measurements within normal limits will improve and Compliance with prescribed medications will improve  Physician Treatment Plan for Secondary Diagnosis: Principal Problem:   Severe recurrent major depression without psychotic features (HCC) Active Problems:   Suicidal ideation   Alcohol use disorder, moderate, dependence (HCC)   Cocaine use disorder, moderate, dependence (HCC)   Tobacco use disorder   Seizure disorder (HCC)  Long Term Goal(s): Improvement in symptoms so as ready for discharge  Short Term Goals: Ability to identify changes in lifestyle to reduce recurrence of condition will improve, Ability to demonstrate self-control will improve and Ability to identify triggers associated with substance abuse/mental health issues will improve   Patrick NorthAVI, Riko Lumsden, MD 09/12/2016, 12:41 PM
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
## 2016-09-12 NOTE — [facility] Group Notes (Signed)
[facility] LCSW Group Therapy  09/12/2016 3:25 PM  Type of Therapy:  Group Therapy  Participation Level:  Patient did not attend group. [name] invited patient to group.   Summary of Progress/Problems:Self esteem: Patients discussed self esteem and how it impacts them. They discussed what aspects in their lives has influenced their self esteem. They were challenged to identify changes that are needed in order to improve self esteem. Patients participated in activity where they had to identify positive adjectives they felt described their personality. Patients shared with the group on the following areas: Things I am good at, What I like about my appearance, I've helped others by, What I value the most, compliments I have received, challenges I have overcome, thing that make me unique, and Times I've made others happy.    Atilano Covelli G. Garnette CzechSampson MSW, LCSWA 09/12/2016, 3:25 PM

## 2016-09-13 NOTE — Progress Notes (Signed)
Pt is alert and oriented x 4, respirations even and unlabored, gait steady and unassisted, no acute distress noted. Denies SI/HI/AVH, anxiety and depression stating "I feel alright." Denies having any pain or discomfort. Pt received the Pneumococcal Vaccine this shift with no complications. No complaints voiced. Stayed in room resting in bed for the majority of this shift. Remains on q15 minute observation rounds for safety. Will continue to monitor.

## 2016-09-13 NOTE — Progress Notes (Signed)
[facility] MD Progress Note  09/13/2016 1:21 PM Danny Johnston.  MRN:  [id number] Subjective:  Danny Johnston is a 45 year old male with a history of depression, mood instability, and substance abuse. Patient was seen this morning and chart reviewed. Patient was admitted with a blood alcohol level of 214. He was also using cocaine. Patient has mostly been in bed sleeping. Per nursing, minimal interaction with peers, irritable.  He is able to contract for safety in the hospital and denies suicidal thoughts this morning.  Principal Problem: Severe recurrent major depression without psychotic features (HCC) Diagnosis:   Patient Active Problem List   Diagnosis Date Noted  . Tobacco use disorder [F17.200] 09/11/2016  . Seizure disorder (HCC) [G40.909] 09/11/2016  . Severe recurrent major depression without psychotic features (HCC) [F33.2] 09/10/2016  . Suicidal ideation [R45.851] 09/10/2016  . Alcohol use disorder, moderate, dependence (HCC) [F10.20] 09/10/2016  . Cocaine use disorder, moderate, dependence (HCC) [F14.20] 09/10/2016   Total Time spent with patient: 20 minutes  Past Psychiatric History: There is a long history of substance abuse. He was admitted to a rehab facility in [place] many years ago. His longest period of sobriety was 8 months, also a long time ago. He uses alcohol and cocaine. In the past he was treated with success with a combination of Prozac and Abilify. He is interested in restarting medicines. He reports one suicide attempt by cutting his wrists.  Past Medical History:  Past Medical History:  Diagnosis Date  . Seizures (HCC)     Past Surgical History:  Procedure Laterality Date  . APPENDECTOMY     Family History: History reviewed. No pertinent family history. Family Psychiatric  History:  Social History:  History  Alcohol Use  . Yes    Comment: consumed 2 pints of "white liquor" this evening     History  Drug Use  . Types: "Crack" cocaine    Social History    Social History  . Marital status: Married    Spouse name: N/A  . Number of children: N/A  . Years of education: N/A   Social History Main Topics  . Smoking status: Heavy Tobacco Smoker    Packs/day: 2.00    Types: Cigarettes  . Smokeless tobacco: Never Used  . Alcohol use Yes     Comment: consumed 2 pints of "white liquor" this evening  . Drug use:     Types: "Crack" cocaine  . Sexual activity: Not Asked   Other Topics Concern  . None   Social History Narrative  . None   Additional Social History: He is disabled from seizures but has not been taking Depakote in a while. His last seizure episode was reported years ago. When on Depakote he is seizure free. He lives with his cousin in [place]. He has health insurance.                         Sleep: Fair  Appetite:  Fair  Current Medications: Current Facility-Administered Medications  Medication Dose Route Frequency Provider Last Rate Last Dose  . alum & mag hydroxide-simeth (MAALOX/MYLANTA) 200-200-20 MG/5ML suspension 30 mL  30 mL Oral Q4H PRN Audery Amel, MD      . chlordiazePOXIDE (LIBRIUM) capsule 25 mg  25 mg Oral QID [name], MD   25 mg at 09/13/16 1144  . divalproex (DEPAKOTE) DR tablet 500 mg  500 mg Oral Q8H [name], MD   500 mg at  09/13/16 1144  . FLUoxetine (PROZAC) capsule 20 mg  20 mg Oral Daily [name], MD   20 mg at 09/13/16 0850  . folic acid (FOLVITE) tablet 1 mg  1 mg Oral Daily Audery AmelJohn T Clapacs, MD   1 mg at 09/13/16 0850  . LORazepam (ATIVAN) tablet 1 mg  1 mg Oral Q6H PRN Audery AmelJohn T Clapacs, MD       Or  . LORazepam (ATIVAN) injection 1 mg  1 mg Intravenous Q6H PRN Audery AmelJohn T Clapacs, MD      . magnesium hydroxide (MILK OF MAGNESIA) suspension 30 mL  30 mL Oral Daily PRN Audery AmelJohn T Clapacs, MD      . multivitamin with minerals tablet 1 tablet  1 tablet Oral Daily Audery AmelJohn T Clapacs, MD   1 tablet at 09/13/16 0850  . nicotine (NICODERM CQ - dosed in mg/24 hours) patch  21 mg  21 mg Transdermal Daily [name], MD      . thiamine (VITAMIN B-1) tablet 100 mg  100 mg Oral Daily Audery AmelJohn T Clapacs, MD   100 mg at 09/13/16 0850   Or  . thiamine (B-1) injection 100 mg  100 mg Intravenous Daily Audery AmelJohn T Clapacs, MD      . traZODone (DESYREL) tablet 100 mg  100 mg Oral QHS Shari ProwsJolanta B Pucilowska, MD   100 mg at 09/12/16 2118    Lab Results:  No results found for this or any previous visit (from the past 48 hour(s)).  Blood Alcohol level:  Lab Results  Component Value Date   ETH 214 (H) 09/09/2016    Metabolic Disorder Labs: No results found for: HGBA1C, MPG No results found for: PROLACTIN No results found for: CHOL, TRIG, HDL, CHOLHDL, VLDL, LDLCALC  Physical Findings: AIMS:  , ,  ,  ,    CIWA:  CIWA-Ar Total: 0 COWS:     Musculoskeletal: Strength & Muscle Tone: within normal limits Gait & Station: normal Patient leans: N/A  Psychiatric Specialty Exam: Physical Exam  ROS  Blood pressure 96/67, pulse 72, temperature 97.7 F (36.5 C), resp. rate 16, height 6\' 3"  (1.905 m), weight 147 lb (66.7 kg), SpO2 100 %.Body mass index is 18.37 kg/m.  General Appearance: Disheveled  Eye Contact:  Minimal  Speech:  Slow  Volume:  Decreased  Mood:  Depressed, Hopeless and Worthless  Affect:  Blunt  Thought Process:  Goal Directed and Descriptions of Associations: Intact  Orientation:  Full (Time, Place, and Person)  Thought Content:  WDL  Suicidal Thoughts:  Yes.  with intent/plan  Homicidal Thoughts:  No  Memory:  Immediate;   Fair Recent;   Fair Remote;   Fair  Judgement:  Poor  Insight:  Lacking  Psychomotor Activity:  Psychomotor Retardation  Concentration:  Concentration: Fair and Attention Span: Fair  Recall:  [employer]Fair  Fund of Knowledge:  Fair  Language:  Fair  Akathisia:  No  Handed:  Right  AIMS (if indicated):     Assets:  Communication Skills Desire for Improvement Financial Resources/Insurance Housing Physical  Health Resilience Social Support  ADL's:  Intact  Cognition:  WNL       Sleep:  Number of Hours: 8.3     Treatment Plan Summary: Daily contact with patient to assess and evaluate symptoms and progress in treatment and Medication management   Danny Johnston is a 45 year old male with a history of depression, mood instability, and substance use admitted for suicidal ideation in the context of  treatment noncompliance, substance abuse, and approaching anniversary of his parents' death.  1. Suicidal ideation. The patient is able to contract for safety in the hospital.  2. Mood. In the past the patient responded well to a combination of Prozac and Abilify. Continue Prozac 20 mg.  3. Alcohol detox. He is on Librium taper. Ativan is available per CIWA scale. CIWA score 0 this morning we'll continue to monitor for withdrawal symptoms  4. Seizure disorder.  Depakote 500 mg tid.  5. Insomnia. Trazodone is available.  6. Smoking. Nicotine patch is available.  7. Substance abuse treatment. The patient is somewhat interested in treatment. He will meet with a social worker to discuss his options.  8. Metabolic syndrome monitoring. Lipid panel, TSH Normal   9. EKG. Sinus bradycardia.  10. Disposition. He will be discharged to home with his cousin. He will follow up with [facility].   Physician Treatment Plan for Primary Diagnosis: Severe recurrent major depression without psychotic features (HCC) Long Term Goal(s): Improvement in symptoms so as ready for discharge  Short Term Goals: Ability to identify changes in lifestyle to reduce recurrence of condition will improve, Ability to verbalize feelings will improve, Ability to disclose and discuss suicidal ideas, Ability to demonstrate self-control will improve, Ability to identify and develop effective coping behaviors will improve, Ability to maintain clinical measurements within normal limits will improve and Compliance with prescribed  medications will improve  Physician Treatment Plan for Secondary Diagnosis: Principal Problem:   Severe recurrent major depression without psychotic features (HCC) Active Problems:   Suicidal ideation   Alcohol use disorder, moderate, dependence (HCC)   Cocaine use disorder, moderate, dependence (HCC)   Tobacco use disorder   Seizure disorder (HCC)  Long Term Goal(s): Improvement in symptoms so as ready for discharge  Short Term Goals: Ability to identify changes in lifestyle to reduce recurrence of condition will improve, Ability to demonstrate self-control will improve and Ability to identify triggers associated with substance abuse/mental health issues will improve   Andre Swander, MD 09/13/2016, 1:21 PM

## 2016-09-13 NOTE — Progress Notes (Addendum)
Patient with sad affect, guarded behavior with staff. No SI/HI at this time. Minimal interaction with peers. Verbals needs appropriately to staff. Safety maintained. No s/s of withdrawal.

## 2016-09-13 NOTE — Plan of Care (Signed)
Problem: Education: Goal: Ability to make informed decisions regarding treatment will improve Outcome: Progressing Pt will continue to make informed decisions regarding treatment.  Problem: Medication: Goal: Compliance with prescribed medication regimen will improve Outcome: Progressing Pt will remain medication compliant.  Problem: Self-Concept: Goal: Ability to disclose and discuss suicidal ideas will improve Outcome: Progressing Pt is able to disclose feelings of anxiety, depression, and suicidal ideation to staff.

## 2016-09-13 NOTE — Plan of Care (Signed)
Problem: Safety: Goal: Ability to remain free from injury will improve Outcome: Progressing Pt has, and will remain free from injury throughout stay.  Problem: Coping: Goal: Ability to cope will improve Outcome: Progressing Pt is able to cope. No negative behaviors noted.  Problem: Medication: Goal: Compliance with prescribed medication regimen will improve Outcome: Progressing Pt is medication compliant.  Problem: Self-Concept: Goal: Ability to verbalize positive feelings about self will improve Outcome: Not Progressing Pt has not verbalized any positive feelings about himself.

## 2016-09-13 NOTE — Progress Notes (Signed)
Pt received the Pneumococcal Vaccine this shift in his left deltoid. No complications noted. No complaints voiced. Will continue to monitor.

## 2016-09-13 NOTE — [facility] Group Notes (Signed)
[facility] LCSW Group Therapy  09/13/2016 3:16 PM  Type of Therapy:  Group Therapy  Participation Level:  Patient did not attend group. [name] invited patient to group.   Summary of Progress/Problems:Coping Skills: Patients defined and discussed healthy coping skills. Patients identified healthy coping skills they would like to try during hospitalization and after discharge. [name] offered insight to varying coping skills that may have been new to patients such as practicing mindfulness.  Raijon Lindfors G. Garnette CzechSampson MSW, LCSWA 09/13/2016, 3:16 PM

## 2016-09-14 LAB — AMMONIA: AMMONIA: 30 umol/L (ref 9–35)

## 2016-09-14 LAB — VALPROIC ACID LEVEL: VALPROIC ACID LVL: 93 ug/mL (ref 50.0–100.0)

## 2016-09-14 MED ORDER — DIVALPROEX SODIUM 500 MG PO DR TAB
500.0000 mg | DELAYED_RELEASE_TABLET | Freq: Three times a day (TID) | ORAL | Status: DC
  Administered 2016-09-15 – 2016-09-18 (×9): 500 mg via ORAL
  Filled 2016-09-14 (×10): qty 1

## 2016-09-14 NOTE — Progress Notes (Signed)
Pt complained of feeling dizzy after vital signs were taken. Was taken back to his room to lay down. BP was a bit low. Pt stated that he does not drink water, and didn't eat much yesterday. Was encouraged to drink more fluids and to eat more. He did state that he was "really sleepy." Was given a Gatorade to rehydrate. No other complaints. Will continue to monitor.

## 2016-09-14 NOTE — Progress Notes (Signed)
[facility] MD Progress Note  09/14/2016 12:23 PM Danny Johnston.  MRN:  [id number] Subjective:  Danny Johnston is a 45 year old male with a history of depression, mood instability, and substance abuse. Patient was admitted with a blood alcohol level of 214. He was also using cocaine.   Danny Johnston continues to be depressed and irritable but no longer suicidal. He has been asleep most of the time and is hard to awake. He spoke with Unk Pinto, [facility] liason, and expressed interest in residential substance abuse treatment program participation. He however was unable to onwilling to talk about it with his Child psychotherapist today. Patient has mostly been in bed sleeping and there is minimal interaction with peers or staff.   Principal Problem: Severe recurrent major depression without psychotic features (HCC) Diagnosis:   Patient Active Problem List   Diagnosis Date Noted  . Tobacco use disorder [F17.200] 09/11/2016  . Seizure disorder (HCC) [G40.909] 09/11/2016  . Severe recurrent major depression without psychotic features (HCC) [F33.2] 09/10/2016  . Suicidal ideation [R45.851] 09/10/2016  . Alcohol use disorder, moderate, dependence (HCC) [F10.20] 09/10/2016  . Cocaine use disorder, moderate, dependence (HCC) [F14.20] 09/10/2016   Total Time spent with patient: 20 minutes  Past Psychiatric History: There is a long history of substance abuse. He was admitted to a rehab facility in [place] many years ago. His longest period of sobriety was 8 months, also a long time ago. He uses alcohol and cocaine. In the past he was treated with success with a combination of Prozac and Abilify. He is interested in restarting medicines. He reports one suicide attempt by cutting his wrists.  Past Medical History:  Past Medical History:  Diagnosis Date  . Seizures (HCC)     Past Surgical History:  Procedure Laterality Date  . APPENDECTOMY     Family History: History reviewed. No pertinent family history. Family  Psychiatric  History:  Social History:  History  Alcohol Use  . Yes    Comment: consumed 2 pints of "white liquor" this evening     History  Drug Use  . Types: "Crack" cocaine    Social History   Social History  . Marital status: Married    Spouse name: N/A  . Number of children: N/A  . Years of education: N/A   Social History Main Topics  . Smoking status: Heavy Tobacco Smoker    Packs/day: 2.00    Types: Cigarettes  . Smokeless tobacco: Never Used  . Alcohol use Yes     Comment: consumed 2 pints of "white liquor" this evening  . Drug use:     Types: "Crack" cocaine  . Sexual activity: Not Asked   Other Topics Concern  . None   Social History Narrative  . None   Additional Social History: He is disabled from seizures but has not been taking Depakote in a while. His last seizure episode was reported years ago. When on Depakote he is seizure free. He lives with his cousin in [place]. He has health insurance.                         Sleep: Fair  Appetite:  Fair  Current Medications: Current Facility-Administered Medications  Medication Dose Route Frequency Provider Last Rate Last Dose  . alum & mag hydroxide-simeth (MAALOX/MYLANTA) 200-200-20 MG/5ML suspension 30 mL  30 mL Oral Q4H PRN Audery Amel, MD      . divalproex (DEPAKOTE) DR tablet 500 mg  500 mg Oral Q8H Orah Sonnen B Jael Kostick, MD   500 mg at 09/14/16 0606  . FLUoxetine (PROZAC) capsule 20 mg  20 mg Oral Daily Shari ProwsJolanta B Virgilio Broadhead, MD   20 mg at 09/14/16 0916  . folic acid (FOLVITE) tablet 1 mg  1 mg Oral Daily Audery AmelJohn T Clapacs, MD   1 mg at 09/14/16 0916  . magnesium hydroxide (MILK OF MAGNESIA) suspension 30 mL  30 mL Oral Daily PRN Audery AmelJohn T Clapacs, MD      . multivitamin with minerals tablet 1 tablet  1 tablet Oral Daily Audery AmelJohn T Clapacs, MD   1 tablet at 09/14/16 0916  . nicotine (NICODERM CQ - dosed in mg/24 hours) patch 21 mg  21 mg Transdermal Daily Cashawn Yanko B Janette Harvie, MD      . thiamine  (VITAMIN B-1) tablet 100 mg  100 mg Oral Daily Audery AmelJohn T Clapacs, MD   100 mg at 09/14/16 [id number]   Or  . thiamine (B-1) injection 100 mg  100 mg Intravenous Daily Audery AmelJohn T Clapacs, MD      . traZODone (DESYREL) tablet 100 mg  100 mg Oral QHS Shari ProwsJolanta B Kohler Pellerito, MD   100 mg at 09/13/16 2119    Lab Results:  No results found for this or any previous visit (from the past 48 hour(s)).  Blood Alcohol level:  Lab Results  Component Value Date   ETH 214 (H) 09/09/2016    Metabolic Disorder Labs: No results found for: HGBA1C, MPG No results found for: PROLACTIN No results found for: CHOL, TRIG, HDL, CHOLHDL, VLDL, LDLCALC  Physical Findings: AIMS:  , ,  ,  ,    CIWA:  CIWA-Ar Total: 0 COWS:     Musculoskeletal: Strength & Muscle Tone: within normal limits Gait & Station: normal Patient leans: N/A  Psychiatric Specialty Exam: Physical Exam  Nursing note and vitals reviewed.   Review of Systems  Psychiatric/Behavioral: Positive for substance abuse.  All other systems reviewed and are negative.   Blood pressure 93/71, pulse 94, temperature 97.8 F (36.6 C), temperature source Oral, resp. rate 16, height 6\' 3"  (1.905 m), weight 66.7 kg (147 lb), SpO2 100 %.Body mass index is 18.37 kg/m.  General Appearance: Disheveled  Eye Contact:  Minimal  Speech:  Slow  Volume:  Decreased  Mood:  Depressed, Hopeless and Worthless  Affect:  Blunt  Thought Process:  Goal Directed and Descriptions of Associations: Intact  Orientation:  Full (Time, Place, and Person)  Thought Content:  WDL  Suicidal Thoughts:   No.  Homicidal Thoughts:  No  Memory:  Immediate;   Fair Recent;   Fair Remote;   Fair  Judgement:  Poor  Insight:  Lacking  Psychomotor Activity:  Psychomotor Retardation  Concentration:  Concentration: Fair and Attention Span: Fair  Recall:  [employer]ervFair  Fund of Knowledge:  Fair  Language:  Fair  Akathisia:  No  Handed:  Right  AIMS (if indicated):     Assets:  Communication  Skills Desire for Improvement Financial Resources/Insurance Housing Physical Health Resilience Social Support  ADL's:  Intact  Cognition:  WNL       Sleep:  Number of Hours: 7     Treatment Plan Summary: Daily contact with patient to assess and evaluate symptoms and progress in treatment and Medication management   [name] is a 45 year old male with a history of depression, mood instability, and substance use admitted for suicidal ideation in the context of treatment noncompliance, substance abuse, and approaching anniversary  of his parents' death.  1. Suicidal ideation. The patient is able to contract for safety in the hospital.  2. Mood. In the past the patient responded well to a combination of Prozac and Abilify. Continue Prozac 20 mg.  3. Alcohol detox. He completed Librium taper.   4. Seizure disorder.  Depakote 500 mg tid. I will hold Depakote and check VPA and Ammonia level.  5. Insomnia. Trazodone is available.  6. Smoking. Nicotine patch is available.  7. Substance abuse treatment. The patient is interested in treatment. [facility]   8. Metabolic syndrome monitoring. Lipid panel, TSH Normal   9. EKG. Sinus bradycardia.  10. Disposition. He will be discharged to home with his cousin. He will follow up with [facility].    Kristine LineaJolanta Bladimir Auman, MD 09/14/2016, 12:23 PM

## 2016-09-14 NOTE — [facility] Group Notes (Signed)
[facility] Group Notes:  (Nursing/MHT/Case Management/Adjunct)  Date:  09/14/2016  Time:  6:45 PM  Type of Therapy:  Psychoeducational Skills  Participation Level:  Did Not Attend  Twanna Hymanda C Mathius Birkeland 09/14/2016, 6:45 PM

## 2016-09-14 NOTE — Tx Team (Deleted)
Pt was admitted after the previous tx team mtg that the [name] was present for (on11/9/17) and before the next scheduled tx meeting (on 09/15/16) and as such, a tx team note was not needed.    Ahyana Skillin F. Sunil Hue, LCSWA, LCAS  09/14/16  

## 2016-09-14 NOTE — Progress Notes (Signed)
Recreation Therapy Notes  Date: 11.13.17 Time: 9:30 am Location: Craft Room  Group Topic: Self-expression  Goal Area(s) Addresses:  Patient will identify one color per emotion listed on wheel. Patient will verbalize benefit of using art as a means of self-expression. Patient will verbalize one emotion experienced during session. Patient will be educated on other forms of self-expression.  Behavioral Response: Did not attend  Intervention: Emotion Wheel  Activity: Patients were given an Arboriculturistmotion Wheel worksheet with different emotions listed. Patients were instructed to pick a color for each emotion listed.  Education: LRT educated patients on different forms of self-expression.  Education Outcome: Patient did not attend group.   Clinical Observations/Feedback: Patient did not attend group.   Jacquelynn CreeGreene,Kwan Shellhammer M, LRT/CTRS 09/14/2016 10:17 AM

## 2016-09-14 NOTE — [facility] Group Notes (Signed)
[facility] Group Notes:  (Nursing/MHT/Case Management/Adjunct)  Date:  09/14/2016  Time:  12:55 AM  Type of Therapy:  Psychoeducational Skills  Participation Level:  Did Not Attend  Summary of Progress/Problems:  Danny MilroyLaquanda Y Markeesha Johnston 09/14/2016, 12:55 AM

## 2016-09-14 NOTE — Progress Notes (Signed)
Pt is alert and oriented x 4. Respirations even and unlabored, gait steady and unassisted, no acute distress noted. Pt remains with a flat and blunt affect, but is pleasant. Denies SI/HI/AVH, and anxiety but does report feeling depressed at a 5/10. Reason for depression is "I don't know. I just feel it." Has been isolative to bedroom all shift, only coming out for medications. Librium taper continues. CIWA was a 0. Remains on q15 minute observation rounds for safety. Will continue to monitor.

## 2016-09-14 NOTE — Progress Notes (Signed)
Affect flat.  Denies SI/HI/AVH.  Verbalizes "I am just tired"  Verbalizes that kept waking up during the night.  Quiet and reserved.  No interaction with peers.  Support and encouragement offered.  Safety maintained.

## 2016-09-15 MED ORDER — ARIPIPRAZOLE 10 MG PO TABS
10.0000 mg | ORAL_TABLET | Freq: Every day | ORAL | Status: DC
  Administered 2016-09-15 – 2016-09-18 (×4): 10 mg via ORAL
  Filled 2016-09-15 (×4): qty 1

## 2016-09-15 MED ORDER — FLUOXETINE HCL 20 MG PO CAPS
40.0000 mg | ORAL_CAPSULE | Freq: Every day | ORAL | Status: DC
  Administered 2016-09-16 – 2016-09-18 (×3): 40 mg via ORAL
  Filled 2016-09-15 (×3): qty 2

## 2016-09-15 NOTE — Progress Notes (Signed)
Patient reports feeling dizzy. Md Clapacs paged, awaiting return call.

## 2016-09-15 NOTE — [facility] Group Notes (Signed)
[facility] Group Notes:  (Nursing/MHT/Case Management/Adjunct)  Date:  09/15/2016  Time:  11:21 PM  Type of Therapy:  Evening Wrap-up Group  Participation Level:  Did Not Attend  Participation Quality:  Did Not Attend  Affect:  N/A  Cognitive:  N/A\  Insight:  None  Engagement in Group:  Did Not Attend  Modes of Intervention:  Discussion  Summary of Progress/Problems:  Tomasita MorrowChelsea Nanta Kamyla Olejnik 09/15/2016, 11:21 PM

## 2016-09-15 NOTE — Progress Notes (Signed)
Pt presents with sad, flat, depressed affect. Isolates to self and room. Poor eye contact, guarded, offers minimal. Pt continues to endorse severe depression and suicidal thoughts. Pt verbally contracts for safety. Pt lays in bed sleeps most of day. Does not attend group. Takes medications as prescribed. Encouragement and support offered. Encouraged patient to attend groups and verbalize feelings. Pt just continues to isolate to room. Pt remains safe on unit with q 15 min checks.

## 2016-09-15 NOTE — Progress Notes (Signed)
Recreation Therapy Notes  Date: 11.14.17 Time: 9:30 am Location: Craft Room  Group Topic: Goal Setting  Goal Area(s) Addresses:   Behavioral Response: Inattentive  Intervention: Recovery Goal Chart  Activity: Patients were instructed to make a Recovery Goal Chart including their goals, obstacles, the date they started working on the goals, and the date they achieved their goals.  Education: LRT educated patients on ways they can celebrate reaching their goals in a healthy way.  Education Outcome: In group clarification offered   Clinical Observations/Feedback: Patient did not participate in group activity. Patient laid his head on the table throughout group.  Jacquelynn CreeGreene,Viann Nielson M, LRT/CTRS 09/15/2016 10:11 AM

## 2016-09-15 NOTE — [facility] Group Notes (Signed)
[facility] Group Notes:  (Nursing/MHT/Case Management/Adjunct)  Date:  09/15/2016  Time:  2:36 PM  Type of Therapy:  Psychoeducational Skills  Participation Level:  None  Participation Quality:  Resistant  Affect:  Labile  Cognitive:  Lacking  Insight:  None  Engagement in Group:  Lacking and Poor  Modes of Intervention:  Discussion and Education  Summary of Progress/Problems:  Mickey Farberamela M Moses Ellison 09/15/2016, 2:36 PM

## 2016-09-15 NOTE — Progress Notes (Signed)
[facility] MD Progress Note  09/15/2016 3:00 PM Brett Albino.  MRN:  [id number] Subjective:  [name] is a 45 year old male with a history of depression, mood instability, and substance abuse. Patient was admitted with a blood alcohol level of 214. He was also using cocaine.   [name] met with treatment team today. He complains of severe depression and suicidal thoughts. He believes that this is partly related to approaching anniversary of his mother's death. She died 70 years ago. He admits that he was on cocaine binge prior to admission and feels tired. In the past he responded well to a combination of Abilify and Prozac. We will Abilify today. His sleep is interrupted and appetite rather poor. He is interested in substance abuse treatment and was referred to [facility] program in [place]. Patient has mostly been in bed sleeping and there is minimal interaction with peers or staff but went to morning groups when encouraged.  Principal Problem: Severe recurrent major depression without psychotic features ([place]) Diagnosis:   Patient Active Problem List   Diagnosis Date Noted  . Tobacco use disorder [F17.200] 09/11/2016  . Seizure disorder ([place]) [K93.818] 09/11/2016  . Severe recurrent major depression without psychotic features ([place]) [F33.2] 09/10/2016  . Suicidal ideation [R45.851] 09/10/2016  . Alcohol use disorder, moderate, dependence ([place]) [F10.20] 09/10/2016  . Cocaine use disorder, moderate, dependence ([place]) [F14.20] 09/10/2016   Total Time spent with patient: 20 minutes  Past Psychiatric History: There is a long history of substance abuse. He was admitted to a rehab facility in [place] many years ago. His longest period of sobriety was 8 months, also a long time ago. He uses alcohol and cocaine. In the past he was treated with success with a combination of Prozac and Abilify. He is interested in restarting medicines. He reports one suicide attempt by cutting his wrists.  Past Medical  History:  Past Medical History:  Diagnosis Date  . Seizures ([place])     Past Surgical History:  Procedure Laterality Date  . APPENDECTOMY     Family History: History reviewed. No pertinent family history. Family Psychiatric  History:  Social History:  History  Alcohol Use  . Yes    Comment: consumed 2 pints of "white liquor" this evening     History  Drug Use  . Types: "Crack" cocaine    Social History   Social History  . Marital status: Married    Spouse name: N/A  . Number of children: N/A  . Years of education: N/A   Social History Main Topics  . Smoking status: Heavy Tobacco Smoker    Packs/day: 2.00    Types: Cigarettes  . Smokeless tobacco: Never Used  . Alcohol use Yes     Comment: consumed 2 pints of "white liquor" this evening  . Drug use:     Types: "Crack" cocaine  . Sexual activity: Not Asked   Other Topics Concern  . None   Social History Narrative  . None   Additional Social History: He is disabled from seizures but has not been taking Depakote in a while. His last seizure episode was reported years ago. When on Depakote he is seizure free. He lives with his cousin in [place]. He has health insurance.                         Sleep: Fair  Appetite:  Fair  Current Medications: Current Facility-Administered Medications  Medication Dose Route Frequency Provider  Last Rate Last Dose  . alum & mag hydroxide-simeth (MAALOX/MYLANTA) 200-200-20 MG/5ML suspension 30 mL  30 mL Oral Q4H PRN Gonzella Lex, MD      . ARIPiprazole (ABILIFY) tablet 10 mg  10 mg Oral Daily Tore Carreker B Lemuel Boodram, MD      . divalproex (DEPAKOTE) DR tablet 500 mg  500 mg Oral Q8H Rinda Rollyson B Norville Dani, MD      . Derrill Memo ON 09/16/2016] FLUoxetine (PROZAC) capsule 40 mg  40 mg Oral Daily Naiomy Watters B Kamyra Schroeck, MD      . folic acid (FOLVITE) tablet 1 mg  1 mg Oral Daily Gonzella Lex, MD   1 mg at 09/15/16 0840  . magnesium hydroxide (MILK OF MAGNESIA) suspension 30 mL  30 mL  Oral Daily PRN Gonzella Lex, MD      . multivitamin with minerals tablet 1 tablet  1 tablet Oral Daily Gonzella Lex, MD   1 tablet at 09/15/16 0840  . nicotine (NICODERM CQ - dosed in mg/24 hours) patch 21 mg  21 mg Transdermal Daily Leeanna Slaby B Krishan Mcbreen, MD      . thiamine (VITAMIN B-1) tablet 100 mg  100 mg Oral Daily Gonzella Lex, MD   100 mg at 09/15/16 0840   Or  . thiamine (B-1) injection 100 mg  100 mg Intravenous Daily Gonzella Lex, MD      . traZODone (DESYREL) tablet 100 mg  100 mg Oral QHS Clovis Fredrickson, MD   100 mg at 09/13/16 2119    Lab Results:  Results for orders placed or performed during the hospital encounter of 09/10/16 (from the past 48 hour(s))  Valproic acid level     Status: None   Collection Time: 09/14/16  1:41 PM  Result Value Ref Range   Valproic Acid Lvl 93 50.0 - 100.0 ug/mL  Ammonia     Status: None   Collection Time: 09/14/16  1:41 PM  Result Value Ref Range   Ammonia 30 9 - 35 umol/L    Blood Alcohol level:  Lab Results  Component Value Date   ETH 214 (H) 44/92/0100    Metabolic Disorder Labs: No results found for: HGBA1C, MPG No results found for: PROLACTIN No results found for: CHOL, TRIG, HDL, CHOLHDL, VLDL, LDLCALC  Physical Findings: AIMS:  , ,  ,  ,    CIWA:  CIWA-Ar Total: 1 COWS:     Musculoskeletal: Strength & Muscle Tone: within normal limits Gait & Station: normal Patient leans: N/A  Psychiatric Specialty Exam: Physical Exam  Nursing note and vitals reviewed.   Review of Systems  Psychiatric/Behavioral: Positive for substance abuse.  All other systems reviewed and are negative.   Blood pressure 105/81, pulse 75, temperature 97.8 F (36.6 C), temperature source Oral, resp. rate 18, height _0  (1.905 m), weight 66.7 kg (147 lb), SpO2 100 %.Body mass index is 18.37 kg/m.  General Appearance: Disheveled  Eye Contact:  Minimal  Speech:  Slow  Volume:  Decreased  Mood:  Depressed, Hopeless and Worthless   Affect:  Blunt  Thought Process:  Goal Directed and Descriptions of Associations: Intact  Orientation:  Full (Time, Place, and Person)  Thought Content:  WDL  Suicidal Thoughts:   No.  Homicidal Thoughts:  No  Memory:  Immediate;   Fair Recent;   Fair Remote;   Fair  Judgement:  Poor  Insight:  Lacking  Psychomotor Activity:  Psychomotor Retardation  Concentration:  Concentration: Fair and Attention  Span: Fair  Recall:  [employer] of Knowledge:  Fair  Language:  Fair  Akathisia:  No  Handed:  Right  AIMS (if indicated):     Assets:  Communication Skills Desire for Improvement Financial Resources/Insurance Housing Physical Health Resilience Social Support  ADL's:  Intact  Cognition:  WNL       Sleep:  Number of Hours: 7     Treatment Plan Summary: Daily contact with patient to assess and evaluate symptoms and progress in treatment and Medication management   Mr. Rasheed is a 45 year old male with a history of depression, mood instability, and substance use admitted for suicidal ideation in the context of treatment noncompliance, substance abuse, and approaching anniversary of his parents' death.  1. Suicidal ideation. The patient is able to contract for safety in the hospital.  2. Mood. In the past the patient responded well to a combination of Prozac and Abilify. We increase Prozac to 40 mg and add Abilify 10 mg.   3. Alcohol detox. He completed Librium taper.   4. Seizure disorder.  Depakote 500 mg tid. Depakote level and check VPA 93, Ammonia level 20.  5. Insomnia. Trazodone is available.  6. Smoking. Nicotine patch is available.  7. Substance abuse treatment. The patient is interested in treatment. There are no beds available at [facility].   8. Metabolic syndrome monitoring. Lipid panel, TSH Normal   9. EKG. Sinus bradycardia.  10. Disposition. He will be discharged to home with his cousin. He will follow up with [facility].    Orson Slick,  MD 09/15/2016, 3:00 PM

## 2016-09-15 NOTE — [facility] Group Notes (Signed)
[facility] LCSW Group Therapy Note  Date/Time: 09/15/2016 3:00pm  Type of Therapy/Topic:  Group Therapy:  Feelings about Diagnosis  Participation Level:  Minimal  Mood: Reports Poor mood   Description of Group:    This group will allow patients to explore their thoughts and feelings about diagnoses they have received. Patients will be guided to explore their level of understanding and acceptance of these diagnoses. Facilitator will encourage patients to process their thoughts and feelings about the reactions of others to their diagnosis, and will guide patients in identifying ways to discuss their diagnosis with significant others in their lives. This group will be process-oriented, with patients participating in exploration of their own experiences as well as giving and receiving support and challenge from other group members.   Therapeutic Goals: 1. Patient will demonstrate understanding of diagnosis as evidence by identifying two or more symptoms of the disorder:  2. Patient will be able to express two feelings regarding the diagnosis 3. Patient will demonstrate ability to communicate their needs through discussion and/or role plays  Summary of Patient Progress:  Pt unable to achieve therapeutic goals.  Pt presents with severe psychomotor retardation.  He barely nods or shakes head side to side to answer questions,  He verbalizes that it is not a good day for him and presents very depressed.  He did however stay the entire group time and appear to try to follow group discussion.   Therapeutic Modalities:   Cognitive Behavioral Therapy Brief Therapy Feelings Identification   Jake SharkSara Chryl Holten,  MSW, LCSW

## 2016-09-15 NOTE — Tx Team (Signed)
Interdisciplinary Treatment and Diagnostic Plan Update  09/15/2016 Time of Session: 10:30am Danny Johnston. MRN: [id number]  Principal Diagnosis: Severe recurrent major depression without psychotic features (HCC)  Secondary Diagnoses: Principal Problem:   Severe recurrent major depression without psychotic features (HCC) Active Problems:   Suicidal ideation   Alcohol use disorder, moderate, dependence (HCC)   Cocaine use disorder, moderate, dependence (HCC)   Tobacco use disorder   Seizure disorder (HCC)   Current Medications:  Current Facility-Administered Medications  Medication Dose Route Frequency Provider Last Rate Last Dose  . alum & mag hydroxide-simeth (MAALOX/MYLANTA) 200-200-20 MG/5ML suspension 30 mL  30 mL Oral Q4H PRN Audery AmelJohn T Clapacs, MD      . divalproex (DEPAKOTE) DR tablet 500 mg  500 mg Oral Q8H [name], MD      . FLUoxetine (PROZAC) capsule 20 mg  20 mg Oral Daily [name], MD   20 mg at 09/15/16 0840  . folic acid (FOLVITE) tablet 1 mg  1 mg Oral Daily Audery AmelJohn T Clapacs, MD   1 mg at 09/15/16 0840  . magnesium hydroxide (MILK OF MAGNESIA) suspension 30 mL  30 mL Oral Daily PRN Audery AmelJohn T Clapacs, MD      . multivitamin with minerals tablet 1 tablet  1 tablet Oral Daily Audery AmelJohn T Clapacs, MD   1 tablet at 09/15/16 0840  . nicotine (NICODERM CQ - dosed in mg/24 hours) patch 21 mg  21 mg Transdermal Daily [name], MD      . thiamine (VITAMIN B-1) tablet 100 mg  100 mg Oral Daily Audery AmelJohn T Clapacs, MD   100 mg at 09/15/16 0840   Or  . thiamine (B-1) injection 100 mg  100 mg Intravenous Daily Audery AmelJohn T Clapacs, MD      . traZODone (DESYREL) tablet 100 mg  100 mg Oral QHS Shari ProwsJolanta B Pucilowska, MD   100 mg at 09/13/16 2119   PTA Medications: Prescriptions Prior to Admission  Medication Sig Dispense Refill Last Dose  . ARIPiprazole (ABILIFY) 5 MG tablet Take 1 tablet by mouth daily.     . citalopram (CELEXA) 40 MG tablet Take 1 tablet by mouth  daily.     . divalproex (DEPAKOTE ER) 500 MG 24 hr tablet Take 500 mg by mouth daily.       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   Physician Treatment Plan for Primary Diagnosis: Severe recurrent major depression without psychotic features (HCC) Long Term Goal(s): Improvement in symptoms so as ready for discharge Improvement in symptoms so as ready for discharge   Short Term Goals: Ability to identify changes in lifestyle to reduce recurrence of condition will improve Ability to verbalize feelings will improve Ability to disclose and discuss suicidal ideas Ability to demonstrate self-control will improve Ability to identify and develop effective coping behaviors will improve Ability to maintain clinical measurements within normal limits will improve Compliance with prescribed medications will improve Ability to identify changes in lifestyle to reduce recurrence of condition will improve Ability to demonstrate self-control will improve Ability to identify triggers associated with substance abuse/mental health issues will improve  Medication Management: Evaluate patient's response, side effects, and tolerance of medication regimen.  Therapeutic Interventions: 1 to 1 sessions, Unit Group sessions and Medication administration.  Evaluation of Outcomes: Progressing  Physician Treatment Plan for Secondary Diagnosis: Principal Problem:   Severe recurrent major depression without psychotic features (HCC) Active Problems:   Suicidal ideation   Alcohol use disorder, moderate, dependence (HCC)   Cocaine use disorder, moderate, dependence (HCC)   Tobacco use disorder   Seizure disorder (HCC)  Long Term Goal(s): Improvement in  symptoms so as ready for discharge Improvement in symptoms so as ready for discharge   Short Term Goals: Ability to identify changes in lifestyle to reduce recurrence of condition will improve Ability to verbalize feelings will improve Ability to disclose and discuss suicidal ideas Ability to demonstrate self-control will improve Ability to identify and develop effective coping behaviors will improve Ability to maintain clinical measurements within normal limits will improve Compliance with prescribed medications will improve Ability to identify changes in lifestyle to reduce recurrence of condition will improve Ability to demonstrate self-control will improve Ability to identify triggers associated with substance abuse/mental health issues will improve     Medication Management: Evaluate patient's response, side effects, and tolerance of medication regimen.  Therapeutic Interventions: 1 to 1 sessions, Unit Group sessions and Medication administration.  Evaluation of Outcomes: Progressing   RN Treatment Plan for Primary Diagnosis: Severe recurrent major depression without psychotic features (HCC) Long Term Goal(s): Knowledge of disease and therapeutic regimen to maintain health will improve  Short Term Goals: Ability to verbalize frustration and anger appropriately will improve, Ability to verbalize feelings will improve, Ability to identify and develop effective coping behaviors will improve and Compliance with prescribed medications will improve  Medication Management: RN will administer medications as ordered by provider, will assess and evaluate patient's response and provide education to patient for prescribed medication. RN will report any adverse and/or side effects to prescribing provider.  Therapeutic Interventions: 1 on 1 counseling sessions, Psychoeducation, Medication administration, Evaluate responses to treatment, Monitor vital signs and CBGs as ordered, Perform/monitor  CIWA, COWS, AIMS and Fall Risk screenings as ordered, Perform wound care treatments as ordered.  Evaluation of Outcomes: Progressing   LCSW Treatment Plan for Primary Diagnosis: Severe recurrent major depression without psychotic features (HCC) Long Term Goal(s): Safe transition to appropriate next level of care at discharge, Engage patient in therapeutic group addressing interpersonal concerns.  Short Term Goals: Engage patient in aftercare planning with referrals and resources, Increase social support, Increase ability to appropriately verbalize feelings, Increase emotional regulation, Facilitate acceptance of mental health diagnosis and concerns, Facilitate patient progression through stages of change regarding substance use diagnoses and concerns, Identify triggers associated with mental health/substance abuse issues and Increase skills for wellness and recovery  Therapeutic Interventions: Assess for all discharge needs, 1 to 1 time with Social worker, Explore available resources and support systems, Assess for adequacy in community support network, Educate family and significant other(s) on suicide prevention, Complete Psychosocial Assessment, Interpersonal group therapy.  Evaluation of Outcomes: Progressing   Progress in Treatment: Attending groups: Yes. Intermittently Participating in groups: No. Taking medication as prescribed: Yes. Toleration medication: Yes. Family/Significant other contact made: No, will contact:  pt's family once permission is given by pt Patient understands diagnosis: Yes. Discussing patient identified problems/goals with staff: Yes. Medical problems stabilized or resolved: Yes. Denies suicidal/homicidal ideation: Yes. Issues/concerns per patient self-inventory: No. Other: none  New problem(s) identified: No, Describe:  none listed  New Short Term/Long Term Goal(s):  Discharge Plan or Barriers: [name] still assessing for appropriate referrals   Reason for  Continuation of Hospitalization: Aggression Anxiety Depression Homicidal ideation Mania Suicidal ideation Withdrawal symptoms  Estimated Length of Stay: 3-5 days  Attendees: Patient: Danny Johnston 09/15/2016 10:56 AM  Physician: Dr. Jennet Maduro, MD 09/15/2016 10:56 AM  Nursing: Hulan Amato, RN 09/15/2016 10:56 AM  RN Care Manager: 09/15/2016 10:56 AM  Social Worker: Dorothe Pea. Durward Parcel 09/15/2016 10:56 AM  Recreational Therapist: Hershal Coria, LRT 09/15/2016 10:56 AM  Social Worker: Jake Shark, LCSW 09/15/2016 10:56 AM  Other:  09/15/2016 10:56 AM  Other: 09/15/2016 10:56 AM    Scribe for Treatment Team: Dorothe Pea Keola Heninger, LCSWA 09/15/2016 10:56 AM     Dorothe Pea. Mekhai Venuto, LCSWA, LCAS 09/14/16

## 2016-09-15 NOTE — [facility] Group Notes (Signed)
[facility] Group Notes:  (Nursing/MHT/Case Management/Adjunct)  Date:  09/15/2016  Time:  5:33 AM  Type of Therapy:  Psychoeducational Skills  Participation Level:  Did Not Attend  Participation QuaSummary of Progress/Problems:  Danny NeerJackie L Cevin Johnston 09/15/2016, 5:33 AM

## 2016-09-15 NOTE — Progress Notes (Signed)
D: Pt denies SI/HI/AVH. Pt is irritable and guarded with information. Patient is not interacting with with peers and staff appropriately.  A: Pt was offered support and encouragement. Pt was given scheduled medications. Pt was encouraged to attend groups. Q 15 minute checks were done for safety.  R:Pt did not attend group. Patient is not  taking medication. Pt is not receptive to treatment on unit. safety maintained on unit.

## 2016-09-15 NOTE — Progress Notes (Signed)
Md returned call. No new orders. Pt currently in bed resting eyes closed.

## 2016-09-15 NOTE — Progress Notes (Signed)
Patient with depressed afffect, sullen with fair eye contact. Unwilling to forward any information during assessment. Requested patient complete daily goal sheet and attend therapy groups. Quiet speech, states he is "tired". No SI/HI at this time. Safety maintained.

## 2016-09-15 NOTE — Plan of Care (Signed)
Problem: Health Behavior/Discharge Planning: Goal: Ability to manage health-related needs will improve Outcome: Not Progressing Patient remains isolated to room. No interaction with staff and peers. Sleeping most of shift

## 2016-09-16 ENCOUNTER — Inpatient Hospital Stay: Payer: Medicaid Other

## 2016-09-16 LAB — CBC
HCT: 41.5 % (ref 40.0–52.0)
Hemoglobin: 14 g/dL (ref 13.0–18.0)
MCH: 32.7 pg (ref 26.0–34.0)
MCHC: 33.7 g/dL (ref 32.0–36.0)
MCV: 96.9 fL (ref 80.0–100.0)
PLATELETS: 133 10*3/uL — AB (ref 150–440)
RBC: 4.28 MIL/uL — ABNORMAL LOW (ref 4.40–5.90)
RDW: 13 % (ref 11.5–14.5)
WBC: 7.1 10*3/uL (ref 3.8–10.6)

## 2016-09-16 LAB — COMPREHENSIVE METABOLIC PANEL
ALK PHOS: 57 U/L (ref 38–126)
ALT: 12 U/L — ABNORMAL LOW (ref 17–63)
ANION GAP: 8 (ref 5–15)
AST: 17 U/L (ref 15–41)
Albumin: 3.9 g/dL (ref 3.5–5.0)
BUN: 13 mg/dL (ref 6–20)
CALCIUM: 9.1 mg/dL (ref 8.9–10.3)
CHLORIDE: 98 mmol/L — AB (ref 101–111)
CO2: 29 mmol/L (ref 22–32)
Creatinine, Ser: 1.06 mg/dL (ref 0.61–1.24)
Glucose, Bld: 109 mg/dL — ABNORMAL HIGH (ref 65–99)
Potassium: 4.3 mmol/L (ref 3.5–5.1)
SODIUM: 135 mmol/L (ref 135–145)
Total Bilirubin: 0.6 mg/dL (ref 0.3–1.2)
Total Protein: 7.5 g/dL (ref 6.5–8.1)

## 2016-09-16 MED ORDER — LEVOFLOXACIN 500 MG PO TABS: 500.0000 mg | ORAL_TABLET | Freq: Every day | ORAL | Status: DC

## 2016-09-16 NOTE — Progress Notes (Addendum)
[employer] MD Progress Note  09/16/2016 12:30 PM Danny Johnston.  MRN:  [id number] Subjective:  Danny Johnston is a 45 year old male with a history of depression, mood instability, and substance abuse. Patient was admitted with a blood alcohol level of 214. He was also using cocaine.   Danny Johnston feels very tired. He is unable to get out of bed and has been using a wheelchair. He met with treatment team yesterday. He continues to complain of severe depression and suicidal thoughts. He believes that this is partly related to approaching anniversary of his mother's death. She died 87 years ago. He tolerates medications well. There are no somatic complaints other than "feeling tired".   Principal Problem: Severe recurrent major depression without psychotic features ([place]) Diagnosis:   Patient Active Problem List   Diagnosis Date Noted  . Tobacco use disorder [F17.200] 09/11/2016  . Seizure disorder ([place]) [C16.606] 09/11/2016  . Severe recurrent major depression without psychotic features ([place]) [F33.2] 09/10/2016  . Suicidal ideation [R45.851] 09/10/2016  . Alcohol use disorder, moderate, dependence ([place]) [F10.20] 09/10/2016  . Cocaine use disorder, moderate, dependence ([place]) [F14.20] 09/10/2016   Total Time spent with patient: 20 minutes  Past Psychiatric History: There is a long history of substance abuse. He was admitted to a rehab facility in [place] many years ago. His longest period of sobriety was 8 months, also a long time ago. He uses alcohol and cocaine. In the past he was treated with success with a combination of Prozac and Abilify. He is interested in restarting medicines. He reports one suicide attempt by cutting his wrists.  Past Medical History:  Past Medical History:  Diagnosis Date  . Seizures ([place])     Past Surgical History:  Procedure Laterality Date  . APPENDECTOMY     Family History: History reviewed. No pertinent family history. Family Psychiatric  History:  Social History:   History  Alcohol Use  . Yes    Comment: consumed 2 pints of "white liquor" this evening     History  Drug Use  . Types: "Crack" cocaine    Social History   Social History  . Marital status: Married    Spouse name: N/A  . Number of children: N/A  . Years of education: N/A   Social History Main Topics  . Smoking status: Heavy Tobacco Smoker    Packs/day: 2.00    Types: Cigarettes  . Smokeless tobacco: Never Used  . Alcohol use Yes     Comment: consumed 2 pints of "white liquor" this evening  . Drug use:     Types: "Crack" cocaine  . Sexual activity: Not Asked   Other Topics Concern  . None   Social History Narrative  . None   Additional Social History: He is disabled from seizures but has not been taking Depakote in a while. His last seizure episode was reported years ago. When on Depakote he is seizure free. He lives with his cousin in [place]. He has health insurance.                         Sleep: Fair  Appetite:  Fair  Current Medications: Current Facility-Administered Medications  Medication Dose Route Frequency Provider Last Rate Last Dose  . alum & mag hydroxide-simeth (MAALOX/MYLANTA) 200-200-20 MG/5ML suspension 30 mL  30 mL Oral Q4H PRN Gonzella Lex, MD      . ARIPiprazole (ABILIFY) tablet 10 mg  10 mg Oral Daily Shion Bluestein B  Lahna Nath, MD   10 mg at 09/16/16 0917  . divalproex (DEPAKOTE) DR tablet 500 mg  500 mg Oral Q8H Cassandra Harbold B Kikuye Korenek, MD   500 mg at 09/16/16 0639  . FLUoxetine (PROZAC) capsule 40 mg  40 mg Oral Daily Clovis Fredrickson, MD   40 mg at 09/16/16 0917  . folic acid (FOLVITE) tablet 1 mg  1 mg Oral Daily Gonzella Lex, MD   1 mg at 09/16/16 0917  . magnesium hydroxide (MILK OF MAGNESIA) suspension 30 mL  30 mL Oral Daily PRN Gonzella Lex, MD      . multivitamin with minerals tablet 1 tablet  1 tablet Oral Daily Gonzella Lex, MD   1 tablet at 09/16/16 2703  . nicotine (NICODERM CQ - dosed in mg/24 hours) patch 21 mg   21 mg Transdermal Daily [facility] [place] B Azul Brumett, MD      . thiamine (VITAMIN B-1) tablet 100 mg  100 mg Oral Daily Gonzella Lex, MD   100 mg at 09/16/16 5009   Or  . thiamine (B-1) injection 100 mg  100 mg Intravenous Daily Gonzella Lex, MD      . traZODone (DESYREL) tablet 100 mg  100 mg Oral QHS Clovis Fredrickson, MD   100 mg at 09/15/16 2137    Lab Results:  Results for orders placed or performed during the hospital encounter of 09/10/16 (from the past 48 hour(s))  Valproic acid level     Status: None   Collection Time: 09/14/16  1:41 PM  Result Value Ref Range   Valproic Acid Lvl 93 50.0 - 100.0 ug/mL  Ammonia     Status: None   Collection Time: 09/14/16  1:41 PM  Result Value Ref Range   Ammonia 30 9 - 35 umol/L    Blood Alcohol level:  Lab Results  Component Value Date   ETH 214 (H) 38/18/2993    Metabolic Disorder Labs: No results found for: HGBA1C, MPG No results found for: PROLACTIN No results found for: CHOL, TRIG, HDL, CHOLHDL, VLDL, LDLCALC  Physical Findings: AIMS:  , ,  ,  ,    CIWA:  CIWA-Ar Total: 0 COWS:     Musculoskeletal: Strength & Muscle Tone: within normal limits Gait & Station: normal Patient leans: N/A  Psychiatric Specialty Exam: Physical Exam  Nursing note and vitals reviewed.   Review of Systems  Psychiatric/Behavioral: Positive for substance abuse.  All other systems reviewed and are negative.   Blood pressure 109/74, pulse 65, temperature 98 F (36.7 C), temperature source Oral, resp. rate 18, height _0  (1.905 m), weight 66.7 kg (147 lb), SpO2 100 %.Body mass index is 18.37 kg/m.  General Appearance: Disheveled  Eye Contact:  Minimal  Speech:  Slow  Volume:  Decreased  Mood:  Depressed, Hopeless and Worthless  Affect:  Blunt  Thought Process:  Goal Directed and Descriptions of Associations: Intact  Orientation:  Full (Time, Place, and Person)  Thought Content:  WDL  Suicidal Thoughts:   No.  Homicidal Thoughts:  No   Memory:  Immediate;   Fair Recent;   Fair Remote;   Fair  Judgement:  Poor  Insight:  Lacking  Psychomotor Activity:  Psychomotor Retardation  Concentration:  Concentration: Fair and Attention Span: Fair  Recall:  [employer] of Knowledge:  Fair  Language:  Fair  Akathisia:  No  Handed:  Right  AIMS (if indicated):     Assets:  Communication Skills Desire for  Improvement Financial Resources/Insurance Housing Physical Health Resilience Social Support  ADL's:  Intact  Cognition:  WNL       Sleep:  Number of Hours: 7.45     Treatment Plan Summary: Daily contact with patient to assess and evaluate symptoms and progress in treatment and Medication management   Danny Johnston is a 45 year old male with a history of depression, mood instability, and substance use admitted for suicidal ideation in the context of treatment noncompliance, substance abuse, and approaching anniversary of his parents' death.  1. Suicidal ideation. The patient is able to contract for safety in the hospital.  2. Mood. In the past the patient responded well to a combination of Prozac and Abilify. We increase Prozac to 40 mg and add Abilify 10 mg.   3. Alcohol detox. He completed Librium taper.   4. Seizure disorder.  Depakote 500 mg tid. Depakote level and check VPA 93, Ammonia level 20.  5. Insomnia. Trazodone is available.  6. Smoking. Nicotine patch is available.  7. Substance abuse treatment. The patient is interested in treatment. There are no beds available at [facility].   8. Metabolic syndrome monitoring. Lipid panel, TSH Normal   9. EKG. Sinus bradycardia.  10. Malaise. We will recheck labs and order chest X-ray.  11. Disposition. He will be discharged to home with his cousin. He will follow up with [facility].    Orson Slick, MD 09/16/2016, 12:30 PM

## 2016-09-16 NOTE — Plan of Care (Signed)
Problem: Safety: Goal: Ability to remain free from injury will improve Outcome: Progressing 15 minute checks maintained for safety, clinical and moral support provided, patient encouraged to continue to express feelings and demonstrate safe care. Patient remains free from harm, will continue to monitor.      

## 2016-09-16 NOTE — Progress Notes (Signed)
Affect flat and sad.  When asked how do you feel patient states "I am just tired"  Utilizes a wheelchair.  Gait unsteady. Patient tends to keep eyes closed when interacting.  Stays in room in bed.  Difficult to awaken at times.  Have to ensure that he gets up for meals otherwise he will sleep through them.  Medication compliant.  Safety maintained with Q 15 min rounding.

## 2016-09-16 NOTE — [facility] Group Notes (Signed)
[facility] LCSW Group Therapy  09/16/2016 1:54 PM  Type of Therapy:  Group Therapy  Participation Level:  Patient did not attend group. [name] invited patient to group.   Summary of Progress/Problems:Emotional Regulation: Patients will identify both negative and positive emotions. They will discuss emotions they have difficulty regulating and how they impact their lives. Patients will be asked to identify healthy coping skills to combat unhealthy reactions to negative emotions.    Jacqulin Brandenburger G. Garnette CzechSampson MSW, LCSWA 09/16/2016, 1:54 PM

## 2016-09-16 NOTE — Progress Notes (Signed)
Recreation Therapy Notes  Date: 11.15.17 Time: 9:30 am Location: Craft Room  Group Topic: Self-esteem  Goal Area(s) Addresses:  Patient will write at least one positive trait about self. Patient will write at least one healthy coping skill.  Behavioral Response: Inattentive  Intervention: All About Me  Activity: Patients were instructed to make a pamphlet including their life's motto, positive traits, healthy coping skills, and their support system.  Education: LRT educated patients on ways they can increase their self-esteem.  Education Outcome: In group clarification offered   Clinical Observations/Feedback: Patient did not participate in activity. Patient did not contribute to group discussion.  Jacquelynn CreeGreene,Britten Parady M, LRT/CTRS 09/16/2016 10:27 AM

## 2016-09-16 NOTE — Progress Notes (Addendum)
Patient observed in his room in bed, spontaneous eyes opening on prompt, endorses passive suicidal ideation, he wants to die by jumping infront of a moving vehicle; he has no means of hurting himself here, according to him. Patient is very unsteady when assisted up during medications administration; [facility] and reminded to prompt for help before getting OOB. A&Ox2, re-oriented to time and date, denied pain, denied AV/H.

## 2016-09-17 MED ORDER — TUBERCULIN PPD 5 UNIT/0.1ML ID SOLN
5.0000 [IU] | Freq: Once | INTRADERMAL | Status: AC
  Administered 2016-09-17: 5 [IU] via INTRADERMAL
  Filled 2016-09-17: qty 0.1

## 2016-09-17 NOTE — [facility] Group Notes (Signed)
[facility] Group Notes:  (Nursing/MHT/Case Management/Adjunct)  Date:  09/17/2016  Time:  5:27 AM  Type of Therapy:  Psychoeducational Skills  Participation Level:  Did Not Attend  Summary of Progress/Problems:  Danny MilroyLaquanda Y [name] 09/17/2016, 5:27 AM

## 2016-09-17 NOTE — Progress Notes (Signed)
Recreation Therapy Notes  Date: 11.16.17 Time: 9:30 am Location: Craft Room  Group Topic: Coping Skills/Leisure Education  Goal Area(s) Addresses:  Patient will identify things they are grateful for. Patient will identify how being grateful can influence decision making.  Behavioral Response: Did not attend  Intervention: Grateful Wheel  Activity: Patients were given an I Am Grateful For worksheet and were instructed to write things they were grateful for under each category.  Education: LRT educated patient on why it is important to be grateful.  Education Outcome: Patient did not attend group.  Clinical Observations/Feedback: Patient did not attend group.  Jacquelynn CreeGreene,Krisna Omar M, LRT/CTRS 09/17/2016 10:10 AM

## 2016-09-17 NOTE — [facility] Group Notes (Signed)
[facility] LCSW Group Therapy  09/17/2016 3:08 PM  Type of Therapy:  Group Therapy  Participation Level:  Patient attended group but did not participate during group.   Participation Quality:  Drowsy  Affect:  Lethargic  Cognitive:  Lacking  Insight:  None  Engagement in Therapy:  None  Modes of Intervention:  Activity, Discussion, Education and Support  Summary of Progress/Problems:Balance in life: Patients will discuss the concept of balance and how it looks and feels to be unbalanced. Pt will identify areas in their life that is unbalanced and ways to become more balanced.   Danny Johnston MSW, LCSWA 09/17/2016, 3:10 PM

## 2016-09-17 NOTE — Tx Team (Signed)
Interdisciplinary Treatment and Diagnostic Plan Update  09/17/2016 Time of Session: 10:30am Danny RodneyGlenn L Bhandari Jr. MRN: [id number]  Principal Diagnosis: Severe recurrent major depression without psychotic features (HCC)  Secondary Diagnoses: Principal Problem:   Severe recurrent major depression without psychotic features (HCC) Active Problems:   Suicidal ideation   Alcohol use disorder, moderate, dependence (HCC)   Cocaine use disorder, moderate, dependence (HCC)   Tobacco use disorder   Seizure disorder (HCC)   Current Medications:  Current Facility-Administered Medications  Medication Dose Route Frequency Provider Last Rate Last Dose  . alum & mag hydroxide-simeth (MAALOX/MYLANTA) 200-200-20 MG/5ML suspension 30 mL  30 mL Oral Q4H PRN Audery AmelJohn T Clapacs, MD      . ARIPiprazole (ABILIFY) tablet 10 mg  10 mg Oral Daily [name], MD   10 mg at 09/17/16 0843  . divalproex (DEPAKOTE) DR tablet 500 mg  500 mg Oral Q8H [name], MD   500 mg at 09/17/16 0647  . FLUoxetine (PROZAC) capsule 40 mg  40 mg Oral Daily Shari ProwsJolanta B Pucilowska, MD   40 mg at 09/17/16 0843  . folic acid (FOLVITE) tablet 1 mg  1 mg Oral Daily Audery AmelJohn T Clapacs, MD   1 mg at 09/17/16 0843  . magnesium hydroxide (MILK OF MAGNESIA) suspension 30 mL  30 mL Oral Daily PRN Audery AmelJohn T Clapacs, MD      . multivitamin with minerals tablet 1 tablet  1 tablet Oral Daily Audery AmelJohn T Clapacs, MD   1 tablet at 09/17/16 0843  . nicotine (NICODERM CQ - dosed in mg/24 hours) patch 21 mg  21 mg Transdermal Daily [name], MD      . thiamine (VITAMIN B-1) tablet 100 mg  100 mg Oral Daily Audery AmelJohn T Clapacs, MD   100 mg at 09/17/16 0844   Or  . thiamine (B-1) injection 100 mg  100 mg Intravenous Daily Audery AmelJohn T Clapacs, MD      . traZODone (DESYREL) tablet 100 mg  100 mg Oral QHS Shari ProwsJolanta B Pucilowska, MD   100 mg at 09/16/16 2138   PTA Medications: Prescriptions Prior to Admission  Medication Sig Dispense Refill Last Dose  .  ARIPiprazole (ABILIFY) 5 MG tablet Take 1 tablet by mouth daily.     . citalopram (CELEXA) 40 MG tablet Take 1 tablet by mouth daily.     . divalproex (DEPAKOTE ER) 500 MG 24 hr tablet Take 500 mg by mouth daily.       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   Physician Treatment Plan for Primary Diagnosis: Severe recurrent major depression without psychotic features (HCC) Long Term Goal(s): Improvement in symptoms so as ready for discharge Improvement in symptoms so as ready for discharge   Short Term Goals: Ability to identify changes in lifestyle to reduce recurrence of condition will improve Ability to verbalize feelings will improve Ability to disclose and discuss suicidal ideas Ability to demonstrate self-control will improve Ability to identify and develop effective coping behaviors will improve Ability to maintain clinical measurements within normal limits will improve Compliance with prescribed medications will improve Ability to identify changes in lifestyle to reduce recurrence of condition will improve Ability to demonstrate self-control will improve Ability to identify triggers associated with substance abuse/mental health issues will improve  Medication Management: Evaluate patient's response,  side effects, and tolerance of medication regimen.  Therapeutic Interventions: 1 to 1 sessions, Unit Group sessions and Medication administration.  Evaluation of Outcomes: Progressing  Physician Treatment Plan for Secondary Diagnosis: Principal Problem:   Severe recurrent major depression without psychotic features (HCC) Active Problems:   Suicidal ideation   Alcohol use disorder, moderate, dependence (HCC)   Cocaine use  disorder, moderate, dependence (HCC)   Tobacco use disorder   Seizure disorder (HCC)  Long Term Goal(s): Improvement in symptoms so as ready for discharge Improvement in symptoms so as ready for discharge   Short Term Goals: Ability to identify changes in lifestyle to reduce recurrence of condition will improve Ability to verbalize feelings will improve Ability to disclose and discuss suicidal ideas Ability to demonstrate self-control will improve Ability to identify and develop effective coping behaviors will improve Ability to maintain clinical measurements within normal limits will improve Compliance with prescribed medications will improve Ability to identify changes in lifestyle to reduce recurrence of condition will improve Ability to demonstrate self-control will improve Ability to identify triggers associated with substance abuse/mental health issues will improve     Medication Management: Evaluate patient's response, side effects, and tolerance of medication regimen.  Therapeutic Interventions: 1 to 1 sessions, Unit Group sessions and Medication administration.  Evaluation of Outcomes: Progressing   RN Treatment Plan for Primary Diagnosis: Severe recurrent major depression without psychotic features (HCC) Long Term Goal(s): Knowledge of disease and therapeutic regimen to maintain health will improve  Short Term Goals: Ability to verbalize frustration and anger appropriately will improve, Ability to verbalize feelings will improve, Ability to identify and develop effective coping behaviors will improve and Compliance with prescribed medications will improve  Medication Management: RN will administer medications as ordered by provider, will assess and evaluate patient's response and provide education to patient for prescribed medication. RN will report any adverse and/or side effects to prescribing provider.  Therapeutic Interventions: 1 on 1 counseling sessions,  Psychoeducation, Medication administration, Evaluate responses to treatment, Monitor vital signs and CBGs as ordered, Perform/monitor CIWA, COWS, AIMS and Fall Risk screenings as ordered, Perform wound care treatments as ordered.  Evaluation of Outcomes: Progressing   LCSW Treatment Plan for Primary Diagnosis: Severe recurrent major depression without psychotic features (HCC) Long Term Goal(s): Safe transition to appropriate next level of care at discharge, Engage patient in therapeutic group addressing interpersonal concerns.  Short Term Goals: Engage patient in aftercare planning with referrals and resources, Increase social support, Increase ability to appropriately verbalize feelings, Increase emotional regulation, Facilitate acceptance of mental health diagnosis and concerns, Facilitate patient progression through stages of change regarding substance use diagnoses and concerns, Identify triggers associated with mental health/substance abuse issues and Increase skills for wellness and recovery  Therapeutic Interventions: Assess for all discharge needs, 1 to 1 time with Social worker, Explore available resources and support systems, Assess for adequacy in community support network, Educate family and significant other(s) on suicide prevention, Complete Psychosocial Assessment, Interpersonal group therapy.  Evaluation of Outcomes: Progressing   Progress in Treatment: Attending groups: Yes. Intermittently Participating in groups: No. Taking medication as prescribed: Yes. Toleration medication: Yes. Family/Significant other contact made: No, will contact:  pt's family once permission is given by pt Patient understands diagnosis: Yes. Discussing patient identified problems/goals with staff: Yes. Medical problems stabilized or resolved: Yes. Denies suicidal/homicidal ideation: Yes. Issues/concerns per patient self-inventory: No. Other: none  New problem(s) identified: No, Describe:  none  listed  New Short Term/Long Term Goal(s):  Discharge Plan  or Barriers: [name] will [facility] [place] [facility] [place] and [facility], substance abuse treatment and therapy.    Reason for Continuation of Hospitalization: Aggression Anxiety Depression Homicidal ideation Mania Suicidal ideation Withdrawal symptoms  Estimated Length of Stay: 3-5 days  Attendees: Patient: Darcus AustinGlenn Hoerner 09/17/2016 10:56 AM  Physician: Dr. Jennet MaduroPucilowska, MD 09/17/2016 10:56 AM  Nursing: Hulan AmatoGwen Farrish, RN 09/17/2016 10:56 AM  RN Care Manager: 09/17/2016 10:56 AM  Social Worker: Dorothe PeaJonathan F. Durward ParcelRiffey, LCSWA, LCAS 09/17/2016 10:56 AM  Recreational Therapist: Hershal CoriaBeth Greene, LRT 09/17/2016 10:56 AM  Social Worker: Jake SharkSara Laws, LCSW 09/17/2016 10:56 AM  Other:  09/17/2016 10:56 AM  Other: 09/17/2016 10:56 AM    Scribe for Treatment Team: Dorothe PeaJonathan F Berlyn Malina, LCSWA 09/17/2016 10:56 AM

## 2016-09-17 NOTE — Progress Notes (Addendum)
[facility] MD Progress Note  09/17/2016 1:18 PM Danny Johnston.  MRN:  [id number] Subjective:  Danny Johnston is a 45 year old male with a history of depression, mood instability, and substance abuse. Patient was admitted with a blood alcohol level of 214. He was also using cocaine.   Danny Johnston continues to feel "tired". He is in his room most of the time except for the meals. He goes to groups with out most encouragement but has not been able to participate. He is unable to get out of bed and has been using a wheelchair. He met with the owner over prospective group home as he is interested in placement. They accepted him once medically cleared. He continues to complain of severe depression and suicidal thoughts. He believes that this is partly related to approaching anniversary of his mother's death. She died 5 years ago. He tolerates medications well. There are no somatic complaints. Labs done yesterday and chest x-ray were unremarkable.   Principal Problem: Severe recurrent major depression without psychotic features ([place]) Diagnosis:   Patient Active Problem List   Diagnosis Date Noted  . Tobacco use disorder [F17.200] 09/11/2016  . Seizure disorder ([place]) [I50.277] 09/11/2016  . Severe recurrent major depression without psychotic features ([place]) [F33.2] 09/10/2016  . Suicidal ideation [R45.851] 09/10/2016  . Alcohol use disorder, moderate, dependence ([place]) [F10.20] 09/10/2016  . Cocaine use disorder, moderate, dependence ([place]) [F14.20] 09/10/2016   Total Time spent with patient: 20 minutes  Past Psychiatric History: There is a long history of substance abuse. He was admitted to a rehab facility in [place] many years ago. His longest period of sobriety was 8 months, also a long time ago. He uses alcohol and cocaine. In the past he was treated with success with a combination of Prozac and Abilify. He is interested in restarting medicines. He reports one suicide attempt by cutting his wrists.  Past  Medical History:  Past Medical History:  Diagnosis Date  . Seizures ([place])     Past Surgical History:  Procedure Laterality Date  . APPENDECTOMY     Family History: History reviewed. No pertinent family history. Family Psychiatric  History:  Social History:  History  Alcohol Use  . Yes    Comment: consumed 2 pints of "white liquor" this evening     History  Drug Use  . Types: "Crack" cocaine    Social History   Social History  . Marital status: Married    Spouse name: N/A  . Number of children: N/A  . Years of education: N/A   Social History Main Topics  . Smoking status: Heavy Tobacco Smoker    Packs/day: 2.00    Types: Cigarettes  . Smokeless tobacco: Never Used  . Alcohol use Yes     Comment: consumed 2 pints of "white liquor" this evening  . Drug use:     Types: "Crack" cocaine  . Sexual activity: Not Asked   Other Topics Concern  . None   Social History Narrative  . None   Additional Social History: He is disabled from seizures but has not been taking Depakote in a while. His last seizure episode was reported years ago. When on Depakote he is seizure free. He lives with his cousin in [place]. He has health insurance.                         Sleep: Fair  Appetite:  Fair  Current Medications: Current Facility-Administered Medications  Medication  Dose Route Frequency Provider Last Rate Last Dose  . alum & mag hydroxide-simeth (MAALOX/MYLANTA) 200-200-20 MG/5ML suspension 30 mL  30 mL Oral Q4H PRN Gonzella Lex, MD      . ARIPiprazole (ABILIFY) tablet 10 mg  10 mg Oral Daily Antwine Agosto B Lindzee Gouge, MD   10 mg at 09/17/16 0843  . divalproex (DEPAKOTE) DR tablet 500 mg  500 mg Oral Q8H Shayann Garbutt B Marguetta Windish, MD   500 mg at 09/17/16 0647  . FLUoxetine (PROZAC) capsule 40 mg  40 mg Oral Daily Clovis Fredrickson, MD   40 mg at 09/17/16 0843  . folic acid (FOLVITE) tablet 1 mg  1 mg Oral Daily Gonzella Lex, MD   1 mg at 09/17/16 0843  . magnesium  hydroxide (MILK OF MAGNESIA) suspension 30 mL  30 mL Oral Daily PRN Gonzella Lex, MD      . multivitamin with minerals tablet 1 tablet  1 tablet Oral Daily Gonzella Lex, MD   1 tablet at 09/17/16 0843  . nicotine (NICODERM CQ - dosed in mg/24 hours) patch 21 mg  21 mg Transdermal Daily Bethan Adamek B Sowmya Partridge, MD      . thiamine (VITAMIN B-1) tablet 100 mg  100 mg Oral Daily Gonzella Lex, MD   100 mg at 09/17/16 0844   Or  . thiamine (B-1) injection 100 mg  100 mg Intravenous Daily Gonzella Lex, MD      . traZODone (DESYREL) tablet 100 mg  100 mg Oral QHS Clovis Fredrickson, MD   100 mg at 09/16/16 2138    Lab Results:  Results for orders placed or performed during the hospital encounter of 09/10/16 (from the past 48 hour(s))  CBC     Status: Abnormal   Collection Time: 09/16/16  3:10 PM  Result Value Ref Range   WBC 7.1 3.8 - 10.6 K/uL   RBC 4.28 (L) 4.40 - 5.90 MIL/uL   Hemoglobin 14.0 13.0 - 18.0 g/dL   HCT 41.5 40.0 - 52.0 %   MCV 96.9 80.0 - 100.0 fL   MCH 32.7 26.0 - 34.0 pg   MCHC 33.7 32.0 - 36.0 g/dL   RDW 13.0 11.5 - 14.5 %   Platelets 133 (L) 150 - 440 K/uL  Comprehensive metabolic panel     Status: Abnormal   Collection Time: 09/16/16  3:10 PM  Result Value Ref Range   Sodium 135 135 - 145 mmol/L   Potassium 4.3 3.5 - 5.1 mmol/L   Chloride 98 (L) 101 - 111 mmol/L   CO2 29 22 - 32 mmol/L   Glucose, Bld 109 (H) 65 - 99 mg/dL   BUN 13 6 - 20 mg/dL   Creatinine, Ser 1.06 0.61 - 1.24 mg/dL   Calcium 9.1 8.9 - 10.3 mg/dL   Total Protein 7.5 6.5 - 8.1 g/dL   Albumin 3.9 3.5 - 5.0 g/dL   AST 17 15 - 41 U/L   ALT 12 (L) 17 - 63 U/L   Alkaline Phosphatase 57 38 - 126 U/L   Total Bilirubin 0.6 0.3 - 1.2 mg/dL   GFR calc non Af Amer >60 >60 mL/min   GFR calc Af Amer >60 >60 mL/min    Comment: (NOTE) The eGFR has been calculated using the CKD EPI equation. This calculation has not been validated in all clinical situations. eGFR's persistently <60 mL/min signify  possible Chronic Kidney Disease.    Anion gap 8 5 - 15  Blood Alcohol level:  Lab Results  Component Value Date   ETH 214 (H) 71/69/6789    Metabolic Disorder Labs: No results found for: HGBA1C, MPG No results found for: PROLACTIN No results found for: CHOL, TRIG, HDL, CHOLHDL, VLDL, LDLCALC  Physical Findings: AIMS:  , ,  ,  ,    CIWA:  CIWA-Ar Total: 0 COWS:     Musculoskeletal: Strength & Muscle Tone: within normal limits Gait & Station: normal Patient leans: N/A  Psychiatric Specialty Exam: Physical Exam  Nursing note and vitals reviewed.   Review of Systems  Constitutional: Positive for malaise/fatigue.  Neurological: Positive for weakness.  Psychiatric/Behavioral: Positive for substance abuse.  All other systems reviewed and are negative.   Blood pressure 119/67, pulse (!) 54, temperature 98.2 F (36.8 C), temperature source Oral, resp. rate 18, height 6' 3"  (1.905 m), weight 66.7 kg (147 lb), SpO2 100 %.Body mass index is 18.37 kg/m.  General Appearance: Disheveled  Eye Contact:  Minimal  Speech:  Slow  Volume:  Decreased  Mood:  Depressed, Hopeless and Worthless  Affect:  Blunt  Thought Process:  Goal Directed and Descriptions of Associations: Intact  Orientation:  Full (Time, Place, and Person)  Thought Content:  WDL  Suicidal Thoughts:   No.  Homicidal Thoughts:  No  Memory:  Immediate;   Fair Recent;   Fair Remote;   Fair  Judgement:  Poor  Insight:  Lacking  Psychomotor Activity:  Psychomotor Retardation  Concentration:  Concentration: Fair and Attention Span: Fair  Recall:  [employer] of Knowledge:  Fair  Language:  Fair  Akathisia:  No  Handed:  Right  AIMS (if indicated):     Assets:  Communication Skills Desire for Improvement Financial Resources/Insurance Housing Physical Health Resilience Social Support  ADL's:  Intact  Cognition:  WNL       Sleep:  Number of Hours: 8.15     Treatment Plan Summary: Daily contact with  patient to assess and evaluate symptoms and progress in treatment and Medication management   Danny Johnston is a 45 year old male with a history of depression, mood instability, and substance use admitted for suicidal ideation in the context of treatment noncompliance, substance abuse, and approaching anniversary of his parents' death.  1. Suicidal ideation. The patient is able to contract for safety in the hospital.  2. Mood. In the past the patient responded well to a combination of Prozac and Abilify. We increase Prozac to 40 mg and add Abilify 10 mg.   3. Alcohol detox. He completed Librium taper.   4. Seizure disorder.  Depakote 500 mg tid. Depakote level and check VPA 93, Ammonia level 20.  5. Insomnia. Trazodone is available.  6. Smoking. Nicotine patch is available.  7. Substance abuse treatment. The patient is interested in treatment. There are no beds available at [facility].   8. Metabolic syndrome monitoring. Lipid panel, TSH Normal   9. EKG. Sinus bradycardia.  10. Malaise. Labs and chest X-ray are unremarkable.  11. Ambulation. The patient is using a wheelchair now. PT consult.  12. Disposition. He will be discharged to a group home. He will follow up with [facility].    Orson Slick, MD 09/17/2016, 1:18 PM

## 2016-09-17 NOTE — Plan of Care (Signed)
Problem: Health Behavior/Discharge Planning: Goal: Identification of resources available to assist in meeting health care needs will improve Outcome: Progressing Patient agreeable to go to group home.

## 2016-09-17 NOTE — Plan of Care (Signed)
Problem: Safety: Goal: Ability to remain free from injury will improve Outcome: Progressing Medications given at bedside, patient has remained in bed, has not been participating in group, has scored 1 or Zero on CIWA, BAL=218 on 09/09/2016, patient, also, was positive for Cocaine metabolites at this time; remains almost catatonic except that he is A&Ox2, he has generalized weakness and incontinent of bladder, poor hygiene and strong body odor, no PRN given, 15 minute checks maintained for safety, clinical and moral support provided, patient encouraged to continue to express feelings and demonstrate safe care. Patient remains free from harm, will continue to monitor.

## 2016-09-17 NOTE — Progress Notes (Signed)
Pt affect is still sad, and flat. Pt continues to endorse depression with passive SI, no plan. Encouraged patient to walk instead of wheelchair, pt reports he will try and walk more tomorrow, reports he is too tired at present. Pt eating and drinking well. Was more alert this shift. Denies HI, AVH. Pt is medication compliant, but isolates mainly to self and room. Minimal interaction with staff and peers. Encouragement and support offered. Pt receptive and remains safe on unit with q 15 min checks

## 2016-09-17 NOTE — Progress Notes (Signed)
Isolated to room, drowsy, gait unsteady, fatigued, low energy, amotivation, uses wheel chair to prevent falls, A&Ox2, re-oriented to time and date; chart reviewed: Valproic Acid level = 93 (50-100 ug/ml), WBC=7.1, afebrile, currently on Depakote 500 mg every 8 hours..Marland Kitchen

## 2016-09-18 ENCOUNTER — Inpatient Hospital Stay: Payer: Medicaid Other

## 2016-09-18 LAB — LIPID PANEL
CHOL/HDL RATIO: 2.6 ratio
CHOLESTEROL: 168 mg/dL (ref 0–200)
HDL: 64 mg/dL (ref >40)
LDL CALC: 76 mg/dL (ref 0–99)
Triglycerides: 142 mg/dL (ref <150)
VLDL: 28 mg/dL (ref 0–40)

## 2016-09-18 LAB — VALPROIC ACID LEVEL
VALPROIC ACID LVL: 88 ug/mL (ref 50.0–100.0)
Valproic Acid Lvl: 107 ug/mL — ABNORMAL HIGH (ref 50.0–100.0)

## 2016-09-18 LAB — RAPID HIV SCREEN (HIV 1/2 AB+AG)
HIV 1/2 Antibodies: NONREACTIVE
HIV-1 P24 ANTIGEN - HIV24: NONREACTIVE

## 2016-09-18 LAB — TSH: TSH: 1.22 u[IU]/mL (ref 0.350–4.500)

## 2016-09-18 LAB — VITAMIN B12: VITAMIN B 12: 318 pg/mL (ref 180–914)

## 2016-09-18 MED ORDER — DIVALPROEX SODIUM 500 MG PO DR TAB
500.0000 mg | DELAYED_RELEASE_TABLET | Freq: Two times a day (BID) | ORAL | Status: DC
  Administered 2016-09-18 – 2016-09-21 (×6): 500 mg via ORAL
  Filled 2016-09-18 (×6): qty 1

## 2016-09-18 MED ORDER — ARIPIPRAZOLE 10 MG PO TABS
10.0000 mg | ORAL_TABLET | Freq: Every day | ORAL | Status: DC
  Administered 2016-09-19 – 2016-09-20 (×2): 10 mg via ORAL
  Filled 2016-09-18 (×2): qty 1

## 2016-09-18 MED ORDER — VENLAFAXINE HCL ER 75 MG PO CP24
150.0000 mg | ORAL_CAPSULE | Freq: Every day | ORAL | Status: DC
  Administered 2016-09-18 – 2016-09-21 (×4): 150 mg via ORAL
  Filled 2016-09-18 (×4): qty 2

## 2016-09-18 NOTE — Evaluation (Signed)
Physical Therapy Evaluation Patient Details Name: Danny RodneyGlenn L Lisle Jr. MRN: [id number] DOB: 04-07-1971 Today's Date: 09/18/2016   History of Present Illness  45 year old male with a history of depression, mood instability, and substance abuse.  He is admitted with suicidal ideations and recent increase in alcohol and cocaine use.  PT orders per pt with decreased ambulation and only using w/c in the unit.  Clinical Impression  Apparently pt had not been doing any walking and only using the w/c since arrival but did do some ambulation this AM.  On PT assessment he was able to walk >200 ft w/o AD and w/o assist.  He was slow and deliberate and reports not quite being at his baseline, but overall he was safe and does not need further PT intervention (here or after d/c).      Follow Up Recommendations No PT follow up    Equipment Recommendations       Recommendations for Other Services       Precautions / Restrictions Precautions Precautions: Fall Restrictions Weight Bearing Restrictions: No      Mobility  Bed Mobility Overal bed mobility: Independent             General bed mobility comments: No issues getting in/out of bed  Transfers Overall transfer level: Independent Equipment used: None             General transfer comment: Pt able to rise from low bed position w/o assist  Ambulation/Gait Ambulation/Gait assistance: Modified independent (Device/Increase time) Ambulation Distance (Feet): 200 Feet Assistive device: None       General Gait Details: Pt with slow but consistent cadence.  He has b/l foot inversion that he reports is baseline and he occasionally was dragging (rather than lifting/stepping) feet, but ultimately he had no LOBs and showed good relative confidence despite slow, deliberate cadence.  Pt with only very minimal fatigue and ultimately does not need to be using the w/c anymore.  Discussed possible cane or walker use if he is feeling too weak.    Stairs            Wheelchair Mobility    Modified Rankin (Stroke Patients Only)       Balance Overall balance assessment: Modified Independent                                           Pertinent Vitals/Pain Pain Assessment: No/denies pain    Home Living Family/patient expects to be discharged to:: Group home Living Arrangements:  (currently lives with relatives, does not wish to return)                    Prior Function Level of Independence: Independent               Hand Dominance        Extremity/Trunk Assessment   Upper Extremity Assessment: Overall WFL for tasks assessed           Lower Extremity Assessment: Overall WFL for tasks assessed         Communication   Communication: No difficulties  Cognition Arousal/Alertness: Awake/alert Behavior During Therapy: WFL for tasks assessed/performed Overall Cognitive Status: Within Functional Limits for tasks assessed                      General Comments      Exercises  Assessment/Plan    PT Assessment Patent does not need any further PT services  PT Problem List            PT Treatment Interventions      PT Goals (Current goals can be found in the Care Plan section)  Acute Rehab PT Goals Patient Stated Goal: get out of here Potential to Achieve Goals: Good    Frequency     Barriers to discharge        Co-evaluation               End of Session Equipment Utilized During Treatment: Gait belt Activity Tolerance: Patient tolerated treatment well Patient left: in bed Nurse Communication: Mobility status    Functional Assessment Tool Used: clinical judgement Functional Limitation: Mobility: Walking and moving around Mobility: Walking and Moving Around Current Status ([id number](G8978): At least 1 percent but less than 20 percent impaired, limited or restricted Mobility: Walking and Moving Around Goal Status [phone](G8979): At least 1 percent but  less than 20 percent impaired, limited or restricted Mobility: Walking and Moving Around Discharge Status [phone](G8980): At least 1 percent but less than 20 percent impaired, limited or restricted    Time: 1351-1405 PT Time Calculation (min) (ACUTE ONLY): 14 min   Charges:   PT Evaluation $PT Eval Low Complexity: 1 Procedure     PT G Codes:   PT G-Codes **NOT FOR INPATIENT CLASS** Functional Assessment Tool Used: clinical judgement Functional Limitation: Mobility: Walking and moving around Mobility: Walking and Moving Around Current Status (B1478(G8978): At least 1 percent but less than 20 percent impaired, limited or restricted Mobility: Walking and Moving Around Goal Status [phone](G8979): At least 1 percent but less than 20 percent impaired, limited or restricted Mobility: Walking and Moving Around Discharge Status [phone](G8980): At least 1 percent but less than 20 percent impaired, limited or restricted    Danny Johnston , DPT 09/18/2016, 3:39 PM

## 2016-09-18 NOTE — Consult Note (Signed)
Medical Consultation  Danny RodneyGlenn L Goldie Jr. [name]:[id number] DOB: [birth year] DOA: 09/10/2016 PCP: Pcp Not In System   Requesting physician:  Dr Demetrius CharityP Date of consultation: 09/18/2016 Reason for consultation: depression and weakness  Impression/Recommendations  45 y/o male with severe depression, substance abuse seizure disorder admitted to Behavioral health for SI/etOh detox and treatment for depression. Hospitalist consult for clearance for ECT due to profound depression and weakness and abnormal chest x-ray.  1. Severe depression with weakness: Patient symptoms are most likely due to severe depression and not an underlying metabolic condition. TSH was checked on admission which is normal. Check B12, RPR and HIV. Depakote level checked this morning which is 107 slightly higher than normal.   2. Abnormal EKG: This appears to be classic for early repolarization. Discussed with Dr Kirke CorinArida.  3. Abnormal chest x-ray: Chest x-ray is showing possible pneumonia however patient has no symptoms, fever or elevated white blood cell count. Can check another chest x-ray today.  4. Tobacco dependence: Patient is highly encouraged to stop smoking. Patient is severely depressed and not at the point to quit. Counseled for 3 minutes  5. Substance abuse with positive cocaine on admission: Management as per psychiatry.  Patient may proceed to ECT. I think his profound weakness and current state is due to depression.   Chief Complaint:  Depression weakness  HPI:  45 year old male with history substance abuse and seizure disorder who presented with suicidal ideation. Hospitalist was consulted for medical clearance prior to ECT. Patient reports that he is severely depressed but denies suicidal ideation. Patient also reports he is weak.  He denies chest pain, shortness of breath, cough, fever, abdominal pain.    Review of Systems  Constitutional: Negative for fever, chills weight loss HENT: Negative for ear  pain, nosebleeds, congestion, facial swelling, rhinorrhea, neck pain, neck stiffness and ear discharge.   Respiratory: Negative for cough, shortness of breath, wheezing  Cardiovascular: Negative for chest pain, palpitations and leg swelling.  Gastrointestinal: Negative for heartburn, abdominal pain, vomiting, diarrhea or consitpation Genitourinary: Negative for dysuria, urgency, frequency, hematuria Musculoskeletal: Negative for back pain or joint pain Neurological: Negative for dizziness, seizures, syncope, focal weakness,  numbness and headaches.  Hematological: Does not bruise/bleed easily.  Psychiatric/Behavioral: Negative for hallucinations, confusion, positive for dysphoric mood   Past Medical History:  Diagnosis Date  . Seizures ([facility])    Past Surgical History:  Procedure Laterality Date  . APPENDECTOMY     Social History:  reports that he has been smoking Cigarettes.  He has been smoking about 2.00 packs per day. He has never used smokeless tobacco. He reports that he drinks alcohol. He reports that he uses drugs, including "Crack" cocaine.  Allergies  Allergen Reactions  . Acetaminophen     Other reaction(s): Unknown  . Aspirin Swelling   History reviewed. No pertinent family history.  Prior to Admission medications   Medication Sig Start Date End Date Taking? Authorizing Provider  ARIPiprazole (ABILIFY) 5 MG tablet Take 1 tablet by mouth daily.    Historical Provider, MD  citalopram (CELEXA) 40 MG tablet Take 1 tablet by mouth daily.    Historical Provider, MD  divalproex (DEPAKOTE ER) 500 MG 24 hr tablet Take 500 mg by mouth daily.    Historical Provider, MD    Physical Exam: Blood pressure 107/86, pulse 71, temperature 97.7 F (36.5 C), temperature source Oral, resp. rate 18, height 6\' 3"  (1.905 m), weight 66.7 kg (147 lb), SpO2 100 %. @VITALS2 @ [employer] PowerFiled Weights  09/11/16 0030  Weight: 66.7 kg (147 lb)    Intake/Output Summary (Last 24 hours) at 09/18/16  1146 Last data filed at 09/18/16 0913  Gross per 24 hour  Intake              840 ml  Output                0 ml  Net              840 ml     Constitutional: Appears Severely depressed and dysphoric. Thin No distress. HENT: Normocephalic. Marland Kitchen. Oropharynx is clear and moist.  Eyes: Conjunctivae and EOM are normal. PERRLA, no scleral icterus.  Neck: Normal ROM. Neck supple. No JVD. No tracheal deviation. CVS: RRR, S1/S2 +, no murmurs, no gallops, no carotid bruit.  Pulmonary: Effort and breath sounds normal, no stridor, rhonchi, wheezes, rales.  Abdominal: Soft. BS +,  no distension, tenderness, rebound or guarding.  Musculoskeletal: Normal range of motion. No edema and no tenderness.  Neuro: Alert. CN 2-12 grossly intact. No focal deficits. Skin: Skin is warm and dry. No rash noted. Psychiatric: Dysphoric mood and affect   Labs  Basic Metabolic Panel:  Recent Labs Lab 09/16/16 1510  NA 135  K 4.3  CL 98*  CO2 29  GLUCOSE 109*  BUN 13  CREATININE 1.06  CALCIUM 9.1   Liver Function Tests:  Recent Labs Lab 09/16/16 1510  AST 17  ALT 12*  ALKPHOS 57  BILITOT 0.6  PROT 7.5  ALBUMIN 3.9   No results for input(s): LIPASE, AMYLASE in the last 168 hours.  CBC:  Recent Labs Lab 09/16/16 1510  WBC 7.1  HGB 14.0  HCT 41.5  MCV 96.9  PLT 133*   Cardiac Enzymes: No results for input(s): CKTOTAL, CKMB, CKMBINDEX, TROPONINI in the last 168 hours. BNP: Invalid input(s): POCBNP CBG: No results for input(s): GLUCAP in the last 168 hours.  Radiological Exams: Dg Chest 2 View  Result Date: 09/16/2016 CLINICAL DATA:  Malaise, fatigue, severe depression, heavy smoker. EXAM: CHEST  2 VIEW COMPARISON:  Portable chest x-ray of January 22, 2013 and January 29, 2011. FINDINGS: The lungs are well-expanded and clear. There is new linear increased density at the right lung base laterally. There is no alveolar infiltrate. There is no pleural effusion. The heart and pulmonary  vascularity are normal. The bony thorax is unremarkable. IMPRESSION: Mild hyperinflation which may be voluntary. Subsegmental atelectasis or less likely early infiltrate at the right lung base which appears new. No alveolar pneumonia nor CHF. Followup PA and lateral chest X-ray is recommended in 3-4 weeks following trial of antibiotic therapy to ensure resolution and exclude underlying malignancy. Electronically Signed   By: David  SwazilandJordan M.D.   On: 09/16/2016 13:49    EKG: Classic early repolarization no AMI/acute STEMI  Thank you for allowing me to participate in the care of your patient.  Thank you for consult  D/w Dr Demetrius CharityP.   Note: This dictation was prepared with Dragon dictation along with smaller phrase technology. Any transcriptional errors that result from this process are unintentional.  Time spent: 45 minutes.  Decklyn Hornik, Patricia PesaSITAL, MD

## 2016-09-18 NOTE — Plan of Care (Signed)
Problem: Safety: Goal: Ability to remain free from injury will improve Outcome: Progressing no PRN given, 15 minute checks maintained for safety, clinical and moral support provided, patient encouraged to continue to express feelings and demonstrate safe care. Patient remains free from harm, will continue to monitor.      

## 2016-09-18 NOTE — Progress Notes (Signed)
Patient remains isolated to his room, in bed most of the time during this shift, tired, unsteady gait, WC at bedside, reactive, A&Ox2, re-oriented to time and date; bedtime medications given at bedtime.

## 2016-09-18 NOTE — Progress Notes (Signed)
Recreation Therapy Notes  Date: 11.17.17 Time: 9:30 am Location: Craft Room  Group Topic: Stress Management  Goal Area(s) Addresses:  Patient will participate in stress management techniques. Patient will verbalize benefit of using stress management techniques.  Behavioral Response: Did not attend  Intervention: Relaxation Techniques  Activity: Patients were given Stress Management techniques. LRT educated patients on stress management techniques and had patients practice the techniques.  Education: LRT educated patients on the importance of using the stress management techniques.  Education Outcome: Patient did not attend group.   Clinical Observations/Feedback: Patient did not attend group.   Jacquelynn CreeGreene,Gabriana Wilmott M, LRT/CTRS 09/18/2016 10:11 AM

## 2016-09-18 NOTE — Consult Note (Signed)
ECT: Consult for 45 year old man with history of severe recurrent depression. Currently patient has been in the hospital for a little over a week. Presented with suicidal ideation and severe depression also in the context of substance abuse. After one week he has not shown much improvement in his mood. Patient reports his mood stays very down and depressed. Feels hopeless. Doesn't feel like he has much in his life to live for. Continues to feel fatigued. Denies any specific plan to kill himself. Denies any current suicidal thoughts. Patient talks about how the anniversary of the death of his parents is a major stressor for him and he usually feels worse this time of year.  Medically the patient has a history of a seizure disorder. No known seizures in 3 or 4 years but currently still on Depakote.  Patient receives disability. Has been living with family. He tells me that Danny Johnston has been talking with him about possibly moving into a halfway house.  Patient is cooperative with the interview. Eye contact minimal. Overall looks very psychomotor slowed. Speech is slow. Affect flat dysphoric minimal reaction. Mood stated as depressed. Still talks about being suicidal but without any specific plan. Denies hallucinations. Appears to be somewhat cognitively slowed but alert and oriented.  This is a 45 year old man with severe major depression. Unresponsive to medications but also with a history of noncompliance and complicating substance abuse. Patient would be potentially be a good candidate for ECT although is limited social support as well as his history of seizure disorders and current treatment with Depakote would be to complicating factors. Reviewed potential plan with the patient. Described treatment. Gave him opportunity to ask questions.  The earliest we would be able to start at this point would be Wednesday the 22nd. Patient was unable to make a decision at this point anyway. I will see him again on  Monday and talk with him again about it to see how he is feeling. No change to medicine for now. I would be willing to pursue ECT although again the Depakote would be a real complication. Nevertheless, I'm sure we would find a way to work it out. Otherwise he is in good health without cardiac risk factors. Clearly still disabled from depression. His long-term recovery however I think will be very dependent on his social support his compliance with medication and his avoidance of a relapse into substance abuse.  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 Possible ECT candidate. I will follow-up after the weekend.

## 2016-09-18 NOTE — Progress Notes (Signed)
[facility] MD Progress Note  09/18/2016 11:34 AM Danny Johnston.  MRN:  [id number] Subjective:  Danny Johnston is a 45 year old male with a history of depression, mood instability, and substance abuse. Patient was admitted with a blood alcohol level of 214. He was also using cocaine. He has not been improving and complains of feeling tired all the time. Not getting out of bed except for meals.  Danny Johnston continues to feel "tired". He is in his room most of the time. He goes to groups with outmost encouragement but has not been able to participate. He is unable to get out of bed and has been using a wheelchair. He met with the owner over prospective group home as he is interested in placement. They accepted him once medically cleared. He continues to complain of severe depression and suicidal thoughts. He believes that this is partly related to approaching anniversary of his mother's death. She died 15 years ago. He tolerates medications well. There are no somatic complaints. I spoke with Dr. Benjie Karvonen who will kindly see the patient.   Principal Problem: Severe recurrent major depression without psychotic features ([place]) Diagnosis:   Patient Active Problem List   Diagnosis Date Noted  . Tobacco use disorder [F17.200] 09/11/2016  . Seizure disorder ([place]) [K44.010] 09/11/2016  . Severe recurrent major depression without psychotic features ([place]) [F33.2] 09/10/2016  . Suicidal ideation [R45.851] 09/10/2016  . Alcohol use disorder, moderate, dependence ([place]) [F10.20] 09/10/2016  . Cocaine use disorder, moderate, dependence ([place]) [F14.20] 09/10/2016   Total Time spent with patient: 20 minutes  Past Psychiatric History: There is a long history of substance abuse. He was admitted to a rehab facility in [place] many years ago. His longest period of sobriety was 8 months, also a long time ago. He uses alcohol and cocaine. In the past he was treated with success with a combination of Prozac and Abilify. He is interested in  restarting medicines. He reports one suicide attempt by cutting his wrists.  Past Medical History:  Past Medical History:  Diagnosis Date  . Seizures ([place])     Past Surgical History:  Procedure Laterality Date  . APPENDECTOMY     Family History: History reviewed. No pertinent family history. Family Psychiatric  History:  Social History:  History  Alcohol Use  . Yes    Comment: consumed 2 pints of "white liquor" this evening     History  Drug Use  . Types: "Crack" cocaine    Social History   Social History  . Marital status: Married    Spouse name: N/A  . Number of children: N/A  . Years of education: N/A   Social History Main Topics  . Smoking status: Heavy Tobacco Smoker    Packs/day: 2.00    Types: Cigarettes  . Smokeless tobacco: Never Used  . Alcohol use Yes     Comment: consumed 2 pints of "white liquor" this evening  . Drug use:     Types: "Crack" cocaine  . Sexual activity: Not Asked   Other Topics Concern  . None   Social History Narrative  . None   Additional Social History: He is disabled from seizures but has not been taking Depakote in a while. His last seizure episode was reported years ago. When on Depakote he is seizure free. He lives with his cousin in [place]. He has health insurance.  Sleep: Fair  Appetite:  Fair  Current Medications: Current Facility-Administered Medications  Medication Dose Route Frequency Provider Last Rate Last Dose  . alum & mag hydroxide-simeth (MAALOX/MYLANTA) 200-200-20 MG/5ML suspension 30 mL  30 mL Oral Q4H PRN Gonzella Lex, MD      . Derrill Memo ON 09/19/2016] ARIPiprazole (ABILIFY) tablet 10 mg  10 mg Oral QHS Tahj Lindseth B Haylynn Pha, MD      . divalproex (DEPAKOTE) DR tablet 500 mg  500 mg Oral Q12H Mavryk Pino B Chukwudi Ewen, MD      . folic acid (FOLVITE) tablet 1 mg  1 mg Oral Daily Gonzella Lex, MD   1 mg at 09/18/16 0807  . magnesium hydroxide (MILK OF MAGNESIA) suspension 30 mL   30 mL Oral Daily PRN Gonzella Lex, MD      . multivitamin with minerals tablet 1 tablet  1 tablet Oral Daily Gonzella Lex, MD   1 tablet at 09/18/16 0807  . nicotine (NICODERM CQ - dosed in mg/24 hours) patch 21 mg  21 mg Transdermal Daily Courtland Reas B Lanayah Gartley, MD      . thiamine (VITAMIN B-1) tablet 100 mg  100 mg Oral Daily Gonzella Lex, MD   100 mg at 09/18/16 0938   Or  . thiamine (B-1) injection 100 mg  100 mg Intravenous Daily Gonzella Lex, MD      . traZODone (DESYREL) tablet 100 mg  100 mg Oral QHS Clovis Fredrickson, MD   100 mg at 09/17/16 2127  . tuberculin injection 5 Units  5 Units Intradermal Once Clovis Fredrickson, MD   5 Units at 09/17/16 1702  . venlafaxine XR (EFFEXOR-XR) 24 hr capsule 150 mg  150 mg Oral Q breakfast Clovis Fredrickson, MD   150 mg at 09/18/16 1829    Lab Results:  Results for orders placed or performed during the hospital encounter of 09/10/16 (from the past 48 hour(s))  CBC     Status: Abnormal   Collection Time: 09/16/16  3:10 PM  Result Value Ref Range   WBC 7.1 3.8 - 10.6 K/uL   RBC 4.28 (L) 4.40 - 5.90 MIL/uL   Hemoglobin 14.0 13.0 - 18.0 g/dL   HCT 41.5 40.0 - 52.0 %   MCV 96.9 80.0 - 100.0 fL   MCH 32.7 26.0 - 34.0 pg   MCHC 33.7 32.0 - 36.0 g/dL   RDW 13.0 11.5 - 14.5 %   Platelets 133 (L) 150 - 440 K/uL  Comprehensive metabolic panel     Status: Abnormal   Collection Time: 09/16/16  3:10 PM  Result Value Ref Range   Sodium 135 135 - 145 mmol/L   Potassium 4.3 3.5 - 5.1 mmol/L   Chloride 98 (L) 101 - 111 mmol/L   CO2 29 22 - 32 mmol/L   Glucose, Bld 109 (H) 65 - 99 mg/dL   BUN 13 6 - 20 mg/dL   Creatinine, Ser 1.06 0.61 - 1.24 mg/dL   Calcium 9.1 8.9 - 10.3 mg/dL   Total Protein 7.5 6.5 - 8.1 g/dL   Albumin 3.9 3.5 - 5.0 g/dL   AST 17 15 - 41 U/L   ALT 12 (L) 17 - 63 U/L   Alkaline Phosphatase 57 38 - 126 U/L   Total Bilirubin 0.6 0.3 - 1.2 mg/dL   GFR calc non Af Amer >60 >60 mL/min   GFR calc Af Amer >60 >60  mL/min    Comment: (NOTE) The eGFR has been  calculated using the CKD EPI equation. This calculation has not been validated in all clinical situations. eGFR's persistently <60 mL/min signify possible Chronic Kidney Disease.    Anion gap 8 5 - 15  Valproic acid level     Status: Abnormal   Collection Time: 09/18/16  7:21 AM  Result Value Ref Range   Valproic Acid Lvl 107 (H) 50.0 - 100.0 ug/mL    Blood Alcohol level:  Lab Results  Component Value Date   ETH 214 (H) 90/24/0973    Metabolic Disorder Labs: No results found for: HGBA1C, MPG No results found for: PROLACTIN No results found for: CHOL, TRIG, HDL, CHOLHDL, VLDL, LDLCALC  Physical Findings: AIMS:  , ,  ,  ,    CIWA:  CIWA-Ar Total: 0 COWS:     Musculoskeletal: Strength & Muscle Tone: within normal limits Gait & Station: normal Patient leans: N/A  Psychiatric Specialty Exam: Physical Exam  Nursing note and vitals reviewed.   Review of Systems  Constitutional: Positive for malaise/fatigue.  Neurological: Positive for weakness.  Psychiatric/Behavioral: Positive for substance abuse.  All other systems reviewed and are negative.   Blood pressure 107/86, pulse 71, temperature 97.7 F (36.5 C), temperature source Oral, resp. rate 18, height 6' 3"  (1.905 m), weight 66.7 kg (147 lb), SpO2 100 %.Body mass index is 18.37 kg/m.  General Appearance: Disheveled  Eye Contact:  Minimal  Speech:  Slow  Volume:  Decreased  Mood:  Depressed, Hopeless and Worthless  Affect:  Blunt  Thought Process:  Goal Directed and Descriptions of Associations: Intact  Orientation:  Full (Time, Place, and Person)  Thought Content:  WDL  Suicidal Thoughts:   No.  Homicidal Thoughts:  No  Memory:  Immediate;   Fair Recent;   Fair Remote;   Fair  Judgement:  Poor  Insight:  Lacking  Psychomotor Activity:  Psychomotor Retardation  Concentration:  Concentration: Fair and Attention Span: Fair  Recall:  [employer] of Knowledge:  Fair   Language:  Fair  Akathisia:  No  Handed:  Right  AIMS (if indicated):     Assets:  Communication Skills Desire for Improvement Financial Resources/Insurance Housing Physical Health Resilience Social Support  ADL's:  Intact  Cognition:  WNL       Sleep:  Number of Hours: 6.3     Treatment Plan Summary: Daily contact with patient to assess and evaluate symptoms and progress in treatment and Medication management   Danny Johnston is a 45 year old male with a history of depression, mood instability, and substance use admitted for suicidal ideation in the context of treatment noncompliance, substance abuse, and approaching anniversary of his parents' death.  1. Suicidal ideation. The patient is able to contract for safety in the hospital.  2. Mood. In the past the patient responded well to a combination of Prozac and Abilify. We switched to Effexor for depression for lack of progress and continue Abilify 10 mg for augmentation.  3. Alcohol detox. He completed Librium taper.   4. Seizure disorder.  Depakote 500 mg tid. Depakote level and check VPA 107. I will lower depakote to 500 mg bid.   5. Insomnia. Trazodone is available.  6. Smoking. Nicotine patch is available.  7. Substance abuse treatment. The patient was interested in treatment but there were no beds available at [facility].   8. Metabolic syndrome monitoring. Lipid panel, TSH, and HgbA1C are pending.  9. EKG. Asymptomatic sinus bradycardia.  10. Malaise. Medicine consult is greatly appreciated.  11.  Ambulation. The patient is using a wheelchair now. PT consult.  12. ECT. If no medical reason found, we will ask Dr. Weber Cooks for ECT consult.  13. Disposition. He will be discharged to a new group home. PPD placed.  He will follow up with [facility].    Orson Slick, MD 09/18/2016, 11:34 AM

## 2016-09-18 NOTE — [facility] Group Notes (Signed)
[facility] LCSW Group Therapy  09/18/2016 4:22 PM  Type of Therapy:  Group Therapy  Participation Level:  Minimal  Participation Quality:  Appropriate  Affect:  Appropriate  Cognitive:  Alert  Insight:  None  Engagement in Therapy:  Limited  Modes of Intervention:  Activity, Discussion, Education and Support  Summary of Progress/Problems:Feelings around Relapse. Group members discussed the meaning of relapse and shared personal stories of relapse, how it affected them and others, and how they perceived themselves during this time. Group members were encouraged to identify triggers, warning signs and coping skills used when facing the possibility of relapse. Social supports were discussed and explored in detail. Patients also discussed facing disappointment and how that can trigger someone to relapse.   Ramina Hulet G. Garnette CzechSampson MSW, LCSWA 09/18/2016, 4:23 PM

## 2016-09-18 NOTE — Progress Notes (Signed)
Pt more alert today. Interacting more with staff and peers. Socializing, playing cards. Pt walking today, no wheelchair. Continues to endorse depression, but reports feeling better. Pt has been med and group compliant. Encouragement and support offered. Pt receptive and remains safe on unit with q 15 min checks. Will continue to assess and monitor for safety.

## 2016-09-19 LAB — COMPREHENSIVE METABOLIC PANEL
ALBUMIN: 3.9 g/dL (ref 3.5–5.0)
ALK PHOS: 52 U/L (ref 38–126)
ALT: 9 U/L — AB (ref 17–63)
AST: 15 U/L (ref 15–41)
Anion gap: 5 (ref 5–15)
BUN: 16 mg/dL (ref 6–20)
CALCIUM: 9.3 mg/dL (ref 8.9–10.3)
CO2: 32 mmol/L (ref 22–32)
CREATININE: 1.1 mg/dL (ref 0.61–1.24)
Chloride: 100 mmol/L — ABNORMAL LOW (ref 101–111)
GFR calc Af Amer: >60 mL/min (ref >60)
GFR calc non Af Amer: >60 mL/min (ref >60)
GLUCOSE: 84 mg/dL (ref 65–99)
Potassium: 4.6 mmol/L (ref 3.5–5.1)
SODIUM: 137 mmol/L (ref 135–145)
Total Bilirubin: 0.4 mg/dL (ref 0.3–1.2)
Total Protein: 7.2 g/dL (ref 6.5–8.1)

## 2016-09-19 LAB — CBC
HCT: 39.1 % — ABNORMAL LOW (ref 40.0–52.0)
HEMOGLOBIN: 13.4 g/dL (ref 13.0–18.0)
MCH: 33.4 pg (ref 26.0–34.0)
MCHC: 34.3 g/dL (ref 32.0–36.0)
MCV: 97.4 fL (ref 80.0–100.0)
PLATELETS: 177 10*3/uL (ref 150–440)
RBC: 4.01 MIL/uL — ABNORMAL LOW (ref 4.40–5.90)
RDW: 13.1 % (ref 11.5–14.5)
WBC: 6.6 10*3/uL (ref 3.8–10.6)

## 2016-09-19 LAB — HEMOGLOBIN A1C
Hgb A1c MFr Bld: 5 % (ref 4.8–5.6)
Mean Plasma Glucose: 97 mg/dL

## 2016-09-19 LAB — RPR: RPR Ser Ql: NONREACTIVE

## 2016-09-19 NOTE — [facility] Group Notes (Signed)
[facility] LCSW Group Therapy  09/19/2016 4:08 PM  Type of Therapy:  Group Therapy  Participation Level:  Active  Participation Quality:  Appropriate  Affect:  Appropriate  Cognitive:  Alert  Insight:  Developing/Improving  Engagement in Therapy:  Engaged  Modes of Intervention:  Activity, Discussion, Education and Support  Summary of Progress/Problems: Self esteem: Patients discussed self esteem and how it impacts them. They discussed what aspects in their lives has influenced their self esteem. They were challenged to identify changes that are needed in order to improve self esteem. Patients participated in activity where they had to identify positive adjectives they felt described their personality. Patients shared with the group on the following areas: Things I am good at, What I like about my appearance, I've helped others by, What I value the most, compliments I have received, challenges I have overcome, thing that make me unique, and Times I've made others happy. Patients also participated in positive socialization with a group game of "Jenga". Patient was in a pleasant mood today and interacted with his peers appropriately and engaged in the group discussion.   Bentli Llorente G. Garnette CzechSampson MSW, LCSWA 09/19/2016, 4:09 PM

## 2016-09-19 NOTE — Progress Notes (Signed)
D:  Per pt self inventory pt reports sleeping good, appetite good, energy level hyper, ability to pay attention good, rates depression at a 0 out of 10, hopelessness at a 0 out of 10, anxiety at a 0 out of 10, denies SI/HI/AVH, goal today: "I love to think", brightens during interaction, spends much of his time in his room, personal  hygiene is lacking, encouraged patient to take shower/attend to personal hygiene, patient verbalized understanding.  A:  Emotional support provided, Encouraged pt to continue with treatment plan and attend all group activities, q15 min checks maintained for safety.  R:  Pt is receptive, going to groups, pleasant and cooperative with staff and other patients on the unit.

## 2016-09-19 NOTE — Progress Notes (Signed)
[facility] MD Progress Note  09/19/2016 3:18 PM Danny Johnston.  MRN:  [id number] Subjective:  [name] is a 45 year old male with a history of depression, mood instability, and substance abuse. Patient was admitted with a blood alcohol level of 214. He was also using cocaine. He has not been improving and complains of feeling tired all the time. Not getting out of bed except for meals.  Mr. Bahri continues to feel "tired". He is in his room most of the time. He goes to groups with outmost encouragement but has not been able to participate. He is unable to get out of bed and has been using a wheelchair. He met with the owner over prospective group home as he is interested in placement. They accepted him once medically cleared. He continues to complain of severe depression and suicidal thoughts. He believes that this is partly related to approaching anniversary of his mother's death. She died 54 years ago. He tolerates medications well. There are no somatic complaints. I spoke with Dr. Benjie Karvonen who will kindly see the patient.   Today the patient was sleeping late in the morning. He was drowsy during assessment. Patient says he is feeling very tired in the daytime feels that he has no energy. He spoke with Dr. Shelbie Ammons about ECT yesterday. It is his understanding that when a star with the procedure next week. Mood is dysphoric but denies suicidality, homicidality or auditory or visual hallucinations. He denies side effects from medications. He denies any physical complaints other than lack of energy  Per nursing: Per pt self inventory pt reports sleeping good, appetite good, energy level hyper, ability to pay attention good, rates depression at a 0 out of 10, hopelessness at a 0 out of 10, anxiety at a 0 out of 10, denies SI/HI/AVH, goal today: "I love to think", brightens during interaction, spends much of his time in his room, personal  hygiene is lacking, encouraged patient to take shower/attend to personal  hygiene, patient verbalized understanding.  Principal Problem: Severe recurrent major depression without psychotic features ([place]) Diagnosis:   Patient Active Problem List   Diagnosis Date Noted  . Tobacco use disorder [F17.200] 09/11/2016  . Seizure disorder ([place]) [E83.151] 09/11/2016  . Severe recurrent major depression without psychotic features ([place]) [F33.2] 09/10/2016  . Suicidal ideation [R45.851] 09/10/2016  . Alcohol use disorder, moderate, dependence ([place]) [F10.20] 09/10/2016  . Cocaine use disorder, moderate, dependence ([place]) [F14.20] 09/10/2016   Total Time spent with patient: 20 minutes  Past Psychiatric History: There is a long history of substance abuse. He was admitted to a rehab facility in [employer] many years ago. His longest period of sobriety was 8 months, also a long time ago. He uses alcohol and cocaine. In the past he was treated with success with a combination of Prozac and Abilify. He is interested in restarting medicines. He reports one suicide attempt by cutting his wrists.  Past Medical History:  Past Medical History:  Diagnosis Date  . Seizures ([place])     Past Surgical History:  Procedure Laterality Date  . APPENDECTOMY     Family History: History reviewed. No pertinent family history. Family Psychiatric  History:  Social History:  History  Alcohol Use  . Yes    Comment: consumed 2 pints of "white liquor" this evening     History  Drug Use  . Types: "Crack" cocaine    Social History   Social History  . Marital status: Married    Spouse name:  N/A  . Number of children: N/A  . Years of education: N/A   Social History Main Topics  . Smoking status: Heavy Tobacco Smoker    Packs/day: 2.00    Types: Cigarettes  . Smokeless tobacco: Never Used  . Alcohol use Yes     Comment: consumed 2 pints of "white liquor" this evening  . Drug use:     Types: "Crack" cocaine  . Sexual activity: Not Asked   Other Topics Concern  . None   Social History  Narrative  . None   Additional Social History: He is disabled from seizures but has not been taking Depakote in a while. His last seizure episode was reported years ago. When on Depakote he is seizure free. He lives with his cousin in [place]. He has health insurance.       Current Medications: Current Facility-Administered Medications  Medication Dose Route Frequency Provider Last Rate Last Dose  . alum & mag hydroxide-simeth (MAALOX/MYLANTA) 200-200-20 MG/5ML suspension 30 mL  30 mL Oral Q4H PRN Gonzella Lex, MD      . ARIPiprazole (ABILIFY) tablet 10 mg  10 mg Oral QHS [name], MD      . divalproex (DEPAKOTE) DR tablet 500 mg  500 mg Oral Q12H [name], MD   500 mg at 09/19/16 0911  . folic acid (FOLVITE) tablet 1 mg  1 mg Oral Daily Gonzella Lex, MD   1 mg at 09/19/16 0910  . magnesium hydroxide (MILK OF MAGNESIA) suspension 30 mL  30 mL Oral Daily PRN Gonzella Lex, MD      . multivitamin with minerals tablet 1 tablet  1 tablet Oral Daily Gonzella Lex, MD   1 tablet at 09/19/16 0911  . nicotine (NICODERM CQ - dosed in mg/24 hours) patch 21 mg  21 mg Transdermal Daily [name], MD      . thiamine (VITAMIN B-1) tablet 100 mg  100 mg Oral Daily Gonzella Lex, MD   100 mg at 09/19/16 3662   Or  . thiamine (B-1) injection 100 mg  100 mg Intravenous Daily Gonzella Lex, MD      . traZODone (DESYREL) tablet 100 mg  100 mg Oral QHS [name], MD   100 mg at 09/18/16 2200  . tuberculin injection 5 Units  5 Units Intradermal Once Clovis Fredrickson, MD   5 Units at 09/17/16 1702  . venlafaxine XR (EFFEXOR-XR) 24 hr capsule 150 mg  150 mg Oral Q breakfast Clovis Fredrickson, MD   150 mg at 09/19/16 0911    Lab Results:  Results for orders placed or performed during the hospital encounter of 09/10/16 (from the past 48 hour(s))  Valproic acid level     Status: Abnormal   Collection Time: 09/18/16  7:21 AM  Result Value Ref Range    Valproic Acid Lvl 107 (H) 50.0 - 100.0 ug/mL  Vitamin B12     Status: None   Collection Time: 09/18/16  2:34 PM  Result Value Ref Range   Vitamin B-12 318 180 - 914 pg/mL    Comment: (NOTE) This assay is not validated for testing neonatal or myeloproliferative syndrome specimens for Vitamin B12 levels. Performed at [facility]   RPR     Status: None   Collection Time: 09/18/16  2:34 PM  Result Value Ref Range   RPR Ser Ql Non Reactive Non Reactive    Comment: (NOTE) Performed At:  [facility] [place], [place] [id number] Lindon Romp MD [id number]   Rapid HIV screen (HIV 1/2 Ab+Ag)     Status: None   Collection Time: 09/18/16  2:34 PM  Result Value Ref Range   HIV-1 P24 Antigen - HIV24 NON REACTIVE NON REACTIVE   HIV 1/2 Antibodies NON REACTIVE NON REACTIVE   Interpretation (HIV Ag Ab)      A non reactive test result means that HIV 1 or HIV 2 antibodies and HIV 1 p24 antigen were not detected in the specimen.  Lipid panel     Status: None   Collection Time: 09/18/16  2:34 PM  Result Value Ref Range   Cholesterol 168 0 - 200 mg/dL   Triglycerides 142 <150 mg/dL   HDL 64 >40 mg/dL   Total CHOL/HDL Ratio 2.6 RATIO   VLDL 28 0 - 40 mg/dL   LDL Cholesterol 76 0 - 99 mg/dL    Comment:        Total Cholesterol/HDL:CHD Risk Coronary Heart Disease Risk Table                     Men   Women  1/2 Average Risk   3.4   3.3  Average Risk       5.0   4.4  2 X Average Risk   9.6   7.1  3 X Average Risk  23.4   11.0        Use the calculated Patient Ratio above and the CHD Risk Table to determine the patient's CHD Risk.        ATP III CLASSIFICATION (LDL):  <100     mg/dL   Optimal  100-129  mg/dL   Near or Above                    Optimal  130-159  mg/dL   Borderline  160-189  mg/dL   High  >190     mg/dL   Very High   Hemoglobin A1c     Status: None   Collection Time: 09/18/16  2:34 PM  Result Value Ref Range   Hgb A1c MFr Bld 5.0 4.8 -  5.6 %    Comment: (NOTE)         Pre-diabetes: 5.7 - 6.4         Diabetes: >6.4         Glycemic control for adults with diabetes: <7.0    Mean Plasma Glucose 97 mg/dL    Comment: (NOTE) Performed At: [facility] [place], [place] [id number] Lindon Romp MD [id number]   TSH     Status: None   Collection Time: 09/18/16  2:34 PM  Result Value Ref Range   TSH 1.220 0.350 - 4.500 uIU/mL    Comment: Performed by a 3rd Generation assay with a functional sensitivity of <=0.01 uIU/mL.  Valproic acid level     Status: None   Collection Time: 09/18/16  2:34 PM  Result Value Ref Range   Valproic Acid Lvl 88 50.0 - 100.0 ug/mL  Comprehensive metabolic panel     Status: Abnormal   Collection Time: 09/19/16  6:33 AM  Result Value Ref Range   Sodium 137 135 - 145 mmol/L   Potassium 4.6 3.5 - 5.1 mmol/L   Chloride 100 (L) 101 - 111 mmol/L   CO2 32 22 - 32 mmol/L   Glucose, Bld 84 65 - 99 mg/dL  BUN 16 6 - 20 mg/dL   Creatinine, Ser 1.10 0.61 - 1.24 mg/dL   Calcium 9.3 8.9 - 10.3 mg/dL   Total Protein 7.2 6.5 - 8.1 g/dL   Albumin 3.9 3.5 - 5.0 g/dL   AST 15 15 - 41 U/L   ALT 9 (L) 17 - 63 U/L   Alkaline Phosphatase 52 38 - 126 U/L   Total Bilirubin 0.4 0.3 - 1.2 mg/dL   GFR calc non Af Amer >60 >60 mL/min   GFR calc Af Amer >60 >60 mL/min    Comment: (NOTE) The eGFR has been calculated using the CKD EPI equation. This calculation has not been validated in all clinical situations. eGFR's persistently <60 mL/min signify possible Chronic Kidney Disease.    Anion gap 5 5 - 15  CBC     Status: Abnormal   Collection Time: 09/19/16  6:33 AM  Result Value Ref Range   WBC 6.6 3.8 - 10.6 K/uL   RBC 4.01 (L) 4.40 - 5.90 MIL/uL   Hemoglobin 13.4 13.0 - 18.0 g/dL   HCT 39.1 (L) 40.0 - 52.0 %   MCV 97.4 80.0 - 100.0 fL   MCH 33.4 26.0 - 34.0 pg   MCHC 34.3 32.0 - 36.0 g/dL   RDW 13.1 11.5 - 14.5 %   Platelets 177 150 - 440 K/uL    Blood Alcohol level:   Lab Results  Component Value Date   ETH 214 (H) 64/15/8309    Metabolic Disorder Labs: Lab Results  Component Value Date   HGBA1C 5.0 09/18/2016   MPG 97 09/18/2016   No results found for: PROLACTIN Lab Results  Component Value Date   CHOL 168 09/18/2016   TRIG 142 09/18/2016   HDL 64 09/18/2016   CHOLHDL 2.6 09/18/2016   VLDL 28 09/18/2016   LDLCALC 76 09/18/2016    Physical Findings: AIMS:  , ,  ,  ,    CIWA:  CIWA-Ar Total: 0 COWS:     Musculoskeletal: Strength & Muscle Tone: within normal limits Gait & Station: normal Patient leans: N/A  Psychiatric Specialty Exam: Physical Exam  Nursing note and vitals reviewed. Constitutional: He is oriented to person, place, and time. He appears well-developed and well-nourished.  HENT:  Head: Normocephalic and atraumatic.  Eyes: EOM are normal.  Neck: Normal range of motion.  Respiratory: Effort normal.  Musculoskeletal: Normal range of motion.  Neurological: He is alert and oriented to person, place, and time.    Review of Systems  Constitutional: Positive for malaise/fatigue.  Neurological: Positive for weakness.  Psychiatric/Behavioral: Positive for substance abuse.  All other systems reviewed and are negative.   Blood pressure 116/72, pulse 66, temperature 98.2 F (36.8 C), temperature source Oral, resp. rate 18, height 6' 3"  (1.905 m), weight 66.7 kg (147 lb), SpO2 100 %.Body mass index is 18.37 kg/m.  General Appearance: Disheveled  Eye Contact:  Minimal  Speech:  Slow  Volume:  Decreased  Mood:  Depressed, Hopeless and Worthless  Affect:  Blunt  Thought Process:  Goal Directed and Descriptions of Associations: Intact  Orientation:  Full (Time, Place, and Person)  Thought Content:  WDL  Suicidal Thoughts:   No.  Homicidal Thoughts:  No  Memory:  Immediate;   Fair Recent;   Fair Remote;   Fair  Judgement:  Poor  Insight:  Lacking  Psychomotor Activity:  Psychomotor Retardation  Concentration:   Concentration: Fair and Attention Span: Fair  Recall:  [employer] of Knowledge:  Fair  Language:  Fair  Akathisia:  No  Handed:  Right  AIMS (if indicated):     Assets:  Communication Skills Desire for Improvement Financial Resources/Insurance Housing Physical Health Resilience Social Support  ADL's:  Intact  Cognition:  WNL       Sleep:  Number of Hours: 7.15     Treatment Plan Summary: Daily contact with patient to assess and evaluate symptoms and progress in treatment and Medication management   Danny Johnston is a 45 year old male with a history of depression, mood instability, and substance use admitted for suicidal ideation in the context of treatment noncompliance, substance abuse, and approaching anniversary of his parents' death.  1. Suicidal ideation. The patient is able to contract for safety in the hospital.  2. Mood. In the past the patient responded well to a combination of Prozac and Abilify. We switched to Effexor for depression for lack of progress and continue Abilify 10 mg for augmentation.  3. Alcohol detox. He completed Librium taper.   4. Seizure disorder.  Depakote 500 mg tid. Depakote level and check VPA 107. I will lower depakote to 500 mg bid.   5. Insomnia. Trazodone is available.  6. Smoking. Nicotine patch is available.  7. Substance abuse treatment. The patient was interested in treatment but there were no beds available at [facility].   8. Metabolic syndrome monitoring. Lipid panel, TSH, and HgbA1C are pending.  9. EKG. Asymptomatic sinus bradycardia.  10. Malaise. Medicine consult is greatly appreciated.  11. Ambulation. The patient is using a wheelchair now. PT consult.  12. ECT. If no medical reason found, we will ask Dr. Weber Cooks for ECT consult.  13. Disposition. He will be discharged to a new group home. PPD placed.  He will follow up with [facility].  11/18 patient appears to be improving at this time no medication changes will be  made. Looks like the patient is being considered for possible ECT on Wednesday.  Hildred Priest, MD 09/19/2016, 3:18 PM

## 2016-09-19 NOTE — Progress Notes (Signed)
D: Pt denies SI/HI/AVH. Pt is pleasant and cooperative, affect is flat and sad .but brightens upon approach. Patient appears less anxious, not interacting with peers and staff appropriately.  A: Pt was offered support and encouragement. Pt was given scheduled medications. Pt was encouraged to attend groups. Q 15 minute checks were done for safety.  R:Pt did not attens group. . Pt has no complaints.Pt receptive to treatment and safety maintained on unit.

## 2016-09-20 NOTE — Progress Notes (Signed)
[facility] MD Progress Note  09/20/2016 3:21 PM Brett Albino.  MRN:  [id number] Subjective:  [name] is a 45 year old male with a history of depression, mood instability, and substance abuse. Patient was admitted with a blood alcohol level of 214. He was also using cocaine. He has not been improving and complains of feeling tired all the time. Not getting out of bed except for meals.  [name] continues to feel "tired". He is in his room most of the time. He goes to groups with outmost encouragement but has not been able to participate. He is unable to get out of bed and has been using a wheelchair. He met with the owner over prospective group home as he is interested in placement. They accepted him once medically cleared. He continues to complain of severe depression and suicidal thoughts. He believes that this is partly related to approaching anniversary of his mother's death. She died 24 years ago. He tolerates medications well. There are no somatic complaints. I spoke with Dr. Benjie Karvonen who will kindly see the patient.   11/18 Today the patient was sleeping late in the morning. He was drowsy during assessment. Patient says he is feeling very tired in the daytime feels that he has no energy. He spoke with Dr. Shelbie Ammons about ECT yesterday. It is his understanding that when a star with the procedure next week. Mood is dysphoric but denies suicidality, homicidality or auditory or visual hallucinations. He denies side effects from medications. He denies any physical complaints other than lack of energy  11/19 patient isn't sleeping late in the morning. He is a small [place] grade, he denies problems with his sleep, appetite, energy, sleep or concentration. He is asking for discharge. He denies suicidality, homicidality or auditory visual hallucinations. He denies side effects from medications or any physical complaints. The patient says that he is interested on ECT but does not want to stay in the hospital any  longer. He is only willing to do it and he can do an outpatient.  Per nursing: Eulas Post pleasant on approach. He spent most of shift resting in bed, he was medication compliant on shift. No behavioral issues to report on shift at this time.   Principal Problem: Severe recurrent major depression without psychotic features ([place]) Diagnosis:   Patient Active Problem List   Diagnosis Date Noted  . Tobacco use disorder [F17.200] 09/11/2016  . Seizure disorder ([place]) [R44.315] 09/11/2016  . Severe recurrent major depression without psychotic features ([place]) [F33.2] 09/10/2016  . Suicidal ideation [R45.851] 09/10/2016  . Alcohol use disorder, moderate, dependence ([place]) [F10.20] 09/10/2016  . Cocaine use disorder, moderate, dependence ([place]) [F14.20] 09/10/2016   Total Time spent with patient: 20 minutes  Past Psychiatric History: There is a long history of substance abuse. He was admitted to a rehab facility in [place] many years ago. His longest period of sobriety was 8 months, also a long time ago. He uses alcohol and cocaine. In the past he was treated with success with a combination of Prozac and Abilify. He is interested in restarting medicines. He reports one suicide attempt by cutting his wrists.  Past Medical History:  Past Medical History:  Diagnosis Date  . Seizures ([place])     Past Surgical History:  Procedure Laterality Date  . APPENDECTOMY     Family History: History reviewed. No pertinent family history. Family Psychiatric  History:  Social History:  History  Alcohol Use  . Yes    Comment: consumed 2 pints  of "white liquor" this evening     History  Drug Use  . Types: "Crack" cocaine    Social History   Social History  . Marital status: Married    Spouse name: N/A  . Number of children: N/A  . Years of education: N/A   Social History Main Topics  . Smoking status: Heavy Tobacco Smoker    Packs/day: 2.00    Types: Cigarettes  . Smokeless tobacco: Never Used  .  Alcohol use Yes     Comment: consumed 2 pints of "white liquor" this evening  . Drug use:     Types: "Crack" cocaine  . Sexual activity: Not Asked   Other Topics Concern  . None   Social History Narrative  . None   Additional Social History: He is disabled from seizures but has not been taking Depakote in a while. His last seizure episode was reported years ago. When on Depakote he is seizure free. He lives with his cousin in [place]. He has health insurance.       Current Medications: Current Facility-Administered Medications  Medication Dose Route Frequency Provider Last Rate Last Dose  . alum & mag hydroxide-simeth (MAALOX/MYLANTA) 200-200-20 MG/5ML suspension 30 mL  30 mL Oral Q4H PRN Gonzella Lex, MD      . ARIPiprazole (ABILIFY) tablet 10 mg  10 mg Oral QHS Clovis Fredrickson, MD   10 mg at 09/19/16 2128  . divalproex (DEPAKOTE) DR tablet 500 mg  500 mg Oral Q12H [name], MD   500 mg at 09/20/16 0802  . folic acid (FOLVITE) tablet 1 mg  1 mg Oral Daily Gonzella Lex, MD   1 mg at 09/20/16 0802  . magnesium hydroxide (MILK OF MAGNESIA) suspension 30 mL  30 mL Oral Daily PRN Gonzella Lex, MD      . multivitamin with minerals tablet 1 tablet  1 tablet Oral Daily Gonzella Lex, MD   1 tablet at 09/20/16 0802  . nicotine (NICODERM CQ - dosed in mg/24 hours) patch 21 mg  21 mg Transdermal Daily [name], MD      . thiamine (VITAMIN B-1) tablet 100 mg  100 mg Oral Daily Gonzella Lex, MD   100 mg at 09/20/16 0802   Or  . thiamine (B-1) injection 100 mg  100 mg Intravenous Daily Gonzella Lex, MD      . traZODone (DESYREL) tablet 100 mg  100 mg Oral QHS Clovis Fredrickson, MD   100 mg at 09/19/16 2128  . venlafaxine XR (EFFEXOR-XR) 24 hr capsule 150 mg  150 mg Oral Q breakfast Clovis Fredrickson, MD   150 mg at 09/20/16 0802    Lab Results:  Results for orders placed or performed during the hospital encounter of 09/10/16 (from the past 48  hour(s))  Comprehensive metabolic panel     Status: Abnormal   Collection Time: 09/19/16  6:33 AM  Result Value Ref Range   Sodium 137 135 - 145 mmol/L   Potassium 4.6 3.5 - 5.1 mmol/L   Chloride 100 (L) 101 - 111 mmol/L   CO2 32 22 - 32 mmol/L   Glucose, Bld 84 65 - 99 mg/dL   BUN 16 6 - 20 mg/dL   Creatinine, Ser 1.10 0.61 - 1.24 mg/dL   Calcium 9.3 8.9 - 10.3 mg/dL   Total Protein 7.2 6.5 - 8.1 g/dL   Albumin 3.9 3.5 - 5.0 g/dL   AST 15  15 - 41 U/L   ALT 9 (L) 17 - 63 U/L   Alkaline Phosphatase 52 38 - 126 U/L   Total Bilirubin 0.4 0.3 - 1.2 mg/dL   GFR calc non Af Amer >60 >60 mL/min   GFR calc Af Amer >60 >60 mL/min    Comment: (NOTE) The eGFR has been calculated using the CKD EPI equation. This calculation has not been validated in all clinical situations. eGFR's persistently <60 mL/min signify possible Chronic Kidney Disease.    Anion gap 5 5 - 15  CBC     Status: Abnormal   Collection Time: 09/19/16  6:33 AM  Result Value Ref Range   WBC 6.6 3.8 - 10.6 K/uL   RBC 4.01 (L) 4.40 - 5.90 MIL/uL   Hemoglobin 13.4 13.0 - 18.0 g/dL   HCT 39.1 (L) 40.0 - 52.0 %   MCV 97.4 80.0 - 100.0 fL   MCH 33.4 26.0 - 34.0 pg   MCHC 34.3 32.0 - 36.0 g/dL   RDW 13.1 11.5 - 14.5 %   Platelets 177 150 - 440 K/uL    Blood Alcohol level:  Lab Results  Component Value Date   ETH 214 (H) 63/11/6008    Metabolic Disorder Labs: Lab Results  Component Value Date   HGBA1C 5.0 09/18/2016   MPG 97 09/18/2016   No results found for: PROLACTIN Lab Results  Component Value Date   CHOL 168 09/18/2016   TRIG 142 09/18/2016   HDL 64 09/18/2016   CHOLHDL 2.6 09/18/2016   VLDL 28 09/18/2016   LDLCALC 76 09/18/2016    Physical Findings: AIMS:  , ,  ,  ,    CIWA:  CIWA-Ar Total: 0 COWS:     Musculoskeletal: Strength & Muscle Tone: within normal limits Gait & Station: normal Patient leans: N/A  Psychiatric Specialty Exam: Physical Exam  Nursing note and vitals  reviewed. Constitutional: He is oriented to person, place, and time. He appears well-developed and well-nourished.  HENT:  Head: Normocephalic and atraumatic.  Eyes: EOM are normal.  Neck: Normal range of motion.  Respiratory: Effort normal.  Musculoskeletal: Normal range of motion.  Neurological: He is alert and oriented to person, place, and time.    Review of Systems  Constitutional: Positive for malaise/fatigue.  Neurological: Positive for weakness.  Psychiatric/Behavioral: Positive for substance abuse.  All other systems reviewed and are negative.   Blood pressure 115/75, pulse 79, temperature 97.7 F (36.5 C), temperature source Oral, resp. rate 18, height _0  (1.905 m), weight 66.7 kg (147 lb), SpO2 100 %.Body mass index is 18.37 kg/m.  General Appearance: Disheveled  Eye Contact:  Minimal  Speech:  Slow  Volume:  Decreased  Mood:  Depressed, Hopeless and Worthless  Affect:  Blunt  Thought Process:  Goal Directed and Descriptions of Associations: Intact  Orientation:  Full (Time, Place, and Person)  Thought Content:  WDL  Suicidal Thoughts:   No.  Homicidal Thoughts:  No  Memory:  Immediate;   Fair Recent;   Fair Remote;   Fair  Judgement:  Poor  Insight:  Lacking  Psychomotor Activity:  Psychomotor Retardation  Concentration:  Concentration: Fair and Attention Span: Fair  Recall:  [employer] of Knowledge:  Fair  Language:  Fair  Akathisia:  No  Handed:  Right  AIMS (if indicated):     Assets:  Communication Skills Desire for Improvement Financial Resources/Insurance Housing Physical Health Resilience Social Support  ADL's:  Intact  Cognition:  WNL  Sleep:  Number of Hours: 6.5     Treatment Plan Summary: Daily contact with patient to assess and evaluate symptoms and progress in treatment and Medication management   Danny Johnston is a 45 year old male with a history of depression, mood instability, and substance use admitted for suicidal  ideation in the context of treatment noncompliance, substance abuse, and approaching anniversary of his parents' death.  1. Suicidal ideation. The patient is able to contract for safety in the hospital.  2. Mood. In the past the patient responded well to a combination of Prozac and Abilify. We switched to Effexor for depression for lack of progress and continue Abilify 10 mg for augmentation.  3. Alcohol detox. He completed Librium taper.   4. Seizure disorder.  Depakote 500 mg tid. Depakote level and check VPA 107. I will lower depakote to 500 mg bid.   5. Insomnia. Trazodone is available.  6. Smoking. Nicotine patch is available.  7. Substance abuse treatment. The patient was interested in treatment but there were no beds available at [facility].   8. Metabolic syndrome monitoring. Lipid panel, TSH, and HgbA1C are pending.  9. EKG. Asymptomatic sinus bradycardia.  10. Malaise. Medicine consult is greatly appreciated.  11. Ambulation. The patient is using a wheelchair now. PT consult.  12. ECT. If no medical reason found, we will ask Dr. Weber Cooks for ECT consult.  13. Disposition. He will be discharged to a new group home. PPD placed.  He will follow up with [facility].  11/18 patient appears to be improving at this time no medication changes will be made. Looks like the patient is being considered for possible ECT on Wednesday.  11/19 patient reports doing well. He does not report any issues or concerns to me today. There are no concerns from nursing. For the last 2 days he's been in his room lying in bed until late in the morning. No medication changes will be made today.  Hildred Priest, MD 09/20/2016, 3:21 PM

## 2016-09-20 NOTE — Progress Notes (Signed)
Danny Johnston pleasant on approach. He spent most of shift resting in bed, he was medication compliant on shift. No behavioral issues to report on shift at this time.

## 2016-09-20 NOTE — [facility] Group Notes (Signed)
[facility] LCSW Group Therapy  09/20/2016 1:23 PM  Type of Therapy:  Group Therapy  Participation Level:  Patient did not attend group. [name] invited patient to group.   Summary of Progress/Problems:Safety Planning: Patients identified fears or worries surrounding discharge. Patients offered support to their peers and openly developed safety plans for their individual needs. Patients developed their own safety plan. Patients discussed their warning signs, coping strategies, support system with family and friends, identified mental health professionals, and how to keep their environments safe (ex. Removing unnecessary medications or removing weapons/guns). Patients then discussed their personalized safety plan with the group.    Slaton Reaser G. Garnette CzechSampson MSW, LCSWA 09/20/2016, 1:25 PM

## 2016-09-20 NOTE — Progress Notes (Signed)
Pt is sleeping, resting quietly with eyes closed in bed, respirations even and unlabored no complaints, 15 min checks continued for safety.

## 2016-09-21 DIAGNOSIS — E538 Deficiency of other specified B group vitamins: Secondary | ICD-10-CM | POA: Diagnosis present

## 2016-09-21 MED ORDER — VITAMIN B-12 1000 MCG PO TABS: 1000.0000 ug | ORAL_TABLET | Freq: Every day | ORAL | Status: DC

## 2016-09-21 MED ORDER — CYANOCOBALAMIN 1000 MCG/ML IJ SOLN
1000.0000 ug | Freq: Once | INTRAMUSCULAR | Status: AC
  Administered 2016-09-21: 1000 ug via INTRAMUSCULAR
  Filled 2016-09-21: qty 1

## 2016-09-21 MED ORDER — CYANOCOBALAMIN 1000 MCG PO TABS: 1000.0000 ug | ORAL_TABLET | Freq: Every day | ORAL | 1 refills | Status: AC

## 2016-09-21 MED ORDER — TRAZODONE HCL 100 MG PO TABS: 100.0000 mg | ORAL_TABLET | Freq: Every day | ORAL | 1 refills | Status: AC

## 2016-09-21 MED ORDER — VENLAFAXINE HCL ER 150 MG PO CP24: 150.0000 mg | ORAL_CAPSULE | Freq: Every day | ORAL | 1 refills | Status: AC

## 2016-09-21 MED ORDER — DIVALPROEX SODIUM 500 MG PO DR TAB: 500.0000 mg | DELAYED_RELEASE_TABLET | Freq: Two times a day (BID) | ORAL | 1 refills | Status: AC

## 2016-09-21 MED ORDER — ARIPIPRAZOLE 10 MG PO TABS: 10.0000 mg | ORAL_TABLET | Freq: Every day | ORAL | 1 refills | Status: AC

## 2016-09-21 NOTE — Progress Notes (Signed)
1325: Patient discharged with transition record, suicide risk assessment and after care summary. Was picked up by Group home employee: FL2 provided. Will be living at the Group Home. All belongings returned to patient: Nothing missing. Pleasant and denying SI/HI upon discharge.

## 2016-09-21 NOTE — Progress Notes (Signed)
  [facility] Discharge Plan :  Will you be returning to the same living situation after discharge:  No. [facility] At discharge, do you have transportation home?: Yes,  pt will be picked up by his group home Do you have the ability to pay for your medications: Yes,  pt will be provided with prescriptions at discharge  Release of information consent forms completed and in the chart;  Patient's signature needed at discharge.  Patient to Follow up at: Follow-up Information    [facility]. Go on 09/29/2016.   Why:  11/28 @ 2:00 with Dr. Midge AverPavelock for medication managment.  Contact information: [facility] [address] [place], [place] [address] [phone]([phone]) [phone]        [facility] Follow up on 09/21/2016.   Why:  [name] will be discharging to [facility]. Contact information: [address] [place], [place] [address] Ph: [phone] Fax: [phone]           Next level of care provider has access to [facility] Link:no  Safety Planning and Suicide Prevention discussed: Yes,  completed with pt  Have you used any form of tobacco in the last 30 days? (Cigarettes, Smokeless Tobacco, Cigars, and/or Pipes): Yes  Has patient been referred to the Quitline?: Patient refused referral  Patient has been referred for addiction treatment: Yes  Danny PeaJonathan F Magaret Johnston 09/21/2016, 4:08 PM

## 2016-09-21 NOTE — Progress Notes (Signed)
D:  Per pt self inventory pt reports sleeping good, appetite good, energy level hyper, rates depression at a 0 out of 10, hopelessness at a 0 out of 10, anxiety at a 0 out of 10, denies SI/HI/AVH, goal today: "Be happy and show all of you that I am going to make it", flat, pleasant during interaction.     A:  Emotional support provided, Encouraged pt to continue with treatment plan and attend all group activities, q15 min checks maintained for safety.  R:  Pt is receptive, going to groups, pleasant and cooperative with staff and other patients on the unit.

## 2016-09-21 NOTE — Progress Notes (Signed)
D: Patient alert and oriented x 3. Patient denies SI/HI/AVH during assessment. Patient took all medications during shift and drank ensure.  A: Staff to monitor Q 15 mins for safety. Encouragement and support offered. Scheduled medications administered per orders. R: Patient remains safe on the unit. Patient attended group tonight. Patient visible on hte unit and interacting with peers. Patient taking administered medications.

## 2016-09-21 NOTE — [place] FL2 (Signed)
[place] MEDICAID FL2 LEVEL OF CARE SCREENING TOOL     IDENTIFICATION  Patient Name: Danny RodneyGlenn L Knouff Jr. Birthdate: [birth year] Sex: male Admission Date (Current Location): 09/10/2016  [place]  [place] [id number] [facility] Facility and Address:  [facility], [address], [place], [place] [address]      Provider Number: [id number]  Attending Physician Name and Address:  Shari ProwsJolanta B Pucilowska, MD  Relative Name and Phone Number:       Current Level of Care: Hospital Recommended Level of Care:  (Group Home) Prior Approval Number:    Date Approved/Denied:   PASRR Number:    Discharge Plan:  (Group home)    Current Diagnoses: Patient Active Problem List   Diagnosis Date Noted  . Vitamin B12 deficiency 09/21/2016  . Tobacco use disorder 09/11/2016  . Seizure disorder ([facility]) 09/11/2016  . Severe recurrent major depression without psychotic features ([facility]) 09/10/2016  . Suicidal ideation 09/10/2016  . Alcohol use disorder, moderate, dependence ([facility]) 09/10/2016  . Cocaine use disorder, moderate, dependence ([facility]) 09/10/2016    Orientation RESPIRATION BLADDER Height & Weight     Self, Time, Situation, Place  Normal Continent Weight: 147 lb (66.7 kg) Height:  6\' 3"  (190.5 cm)  BEHAVIORAL SYMPTOMS/MOOD NEUROLOGICAL BOWEL NUTRITION STATUS      Continent    AMBULATORY STATUS COMMUNICATION OF NEEDS Skin   Independent Verbally Normal                       Personal Care Assistance Level of Assistance              Functional Limitations Info             SPECIAL CARE FACTORS FREQUENCY                       Contractures Contractures Info: Not present    Additional Factors Info                  Current Medications (09/21/2016):  This is the current hospital active medication list Current Facility-Administered Medications  Medication Dose Route Frequency Provider Last Rate Last Dose  . alum & mag  hydroxide-simeth (MAALOX/MYLANTA) 200-200-20 MG/5ML suspension 30 mL  30 mL Oral Q4H PRN Audery AmelJohn T Clapacs, MD      . ARIPiprazole (ABILIFY) tablet 10 mg  10 mg Oral QHS Shari ProwsJolanta B Pucilowska, MD   10 mg at 09/20/16 2202  . divalproex (DEPAKOTE) DR tablet 500 mg  500 mg Oral Q12H [name], MD   500 mg at 09/21/16 0830  . folic acid (FOLVITE) tablet 1 mg  1 mg Oral Daily Audery AmelJohn T Clapacs, MD   1 mg at 09/21/16 0830  . magnesium hydroxide (MILK OF MAGNESIA) suspension 30 mL  30 mL Oral Daily PRN Audery AmelJohn T Clapacs, MD      . multivitamin with minerals tablet 1 tablet  1 tablet Oral Daily Audery AmelJohn T Clapacs, MD   1 tablet at 09/21/16 0830  . nicotine (NICODERM CQ - dosed in mg/24 hours) patch 21 mg  21 mg Transdermal Daily [name], MD      . traZODone (DESYREL) tablet 100 mg  100 mg Oral QHS Shari ProwsJolanta B Pucilowska, MD   100 mg at 09/20/16 2202  . venlafaxine XR (EFFEXOR-XR) 24 hr capsule 150 mg  150 mg Oral Q breakfast [name], MD   150 mg at 09/21/16 0830  . vitamin  B-12 (CYANOCOBALAMIN) tablet 1,000 mcg  1,000 mcg Oral Daily Shari ProwsJolanta B Pucilowska, MD         Discharge Medications: Please see discharge summary for a list of discharge medications.  Relevant Imaging Results:  Relevant Lab Results:   Additional Information    Dorothe PeaJonathan F Greidy Sherard, LCSWA

## 2016-09-21 NOTE — Discharge Summary (Signed)
family Physician Discharge Summary Note  Patient:  Danny Johnston. is an 45 y.o., male MRN:  [id number] DOB:  Sep 10, 1971 Patient phone:  [phone] (home)  Patient address:   [address] [place] [address],  Total Time spent with patient: 30 minutes  Date of Admission:  09/10/2016 Date of Discharge: 09/21/2016  Reason for Admission:  Suicidal ideation.  Identifying data. Danny Johnston is a 45 year old male with a history of depression, mood instability, and substance abuse.  Chief complaint. "Suicidal ideation."  History of present illness. Information was obtained from the patient and the chart. The patient has a long history of substance abuse and depression. His drinking escalated recently his been using cocaine heavily. He believes that this is due to worsening of depression as the holidays are coming and he always gets depressed missing his deceased parents. He does not have any family that he is close to. He finds holidays difficult to manage. He reports poor sleep, decreased appetite, anhedonia, feeling of guilt and hopelessness worthlessness, poor energy and concentration, social isolation, and crying status. He recently developed suicidal thinking with a plan to cut himself or overdose. His drinking escalated. Blood alcohol level on admission was 214. He's been also using cocaine. He has not been compliant with medications and has not been seen mental health professionals lately. He does not participate in any substance abuse treatment. He denies psychotic symptoms or symptoms suggestive of PTSD or OCD. There are no symptoms suggestive of bipolar mania.  Past psychiatric history. There is a long history of substance abuse. He was admitted to a rehab facility in [place] many years ago. His longest period of sobriety was 8 months, also a long time ago. He uses alcohol and cocaine. In the past he was treated with success with a combination of Prozac and Abilify. He is interested in  restarting medicines. He reports one suicide attempt by cutting his wrists.  Family psychiatric history. Nonreported.  Social history. He is disabled from seizures but has not been taking Depakote in a while. His last seizure episode was reported years ago. When on Depakote he is seizure free. He lives with his cousin in [place]. He has health insurance.  Principal Problem: Severe recurrent major depression without psychotic features [facility]) Discharge Diagnoses: Patient Active Problem List   Diagnosis Date Noted  . Vitamin B12 deficiency [E53.8] 09/21/2016  . Tobacco use disorder [F17.200] 09/11/2016  . Seizure disorder (HCC) [G40.909] 09/11/2016  . Severe recurrent major depression without psychotic features (HCC) [F33.2] 09/10/2016  . Suicidal ideation [R45.851] 09/10/2016  . Alcohol use disorder, moderate, dependence (HCC) [F10.20] 09/10/2016  . Cocaine use disorder, moderate, dependence (HCC) [F14.20] 09/10/2016   Past Medical History:  Past Medical History:  Diagnosis Date  . Seizures (HCC)     Past Surgical History:  Procedure Laterality Date  . APPENDECTOMY     Family History: History reviewed. No pertinent family history.  Social History:  History  Alcohol Use  . Yes    Comment: consumed 2 pints of "white liquor" this evening     History  Drug Use  . Types: "Crack" cocaine    Social History   Social History  . Marital status: Married    Spouse name: N/A  . Number of children: N/A  . Years of education: N/A   Social History Main Topics  . Smoking status: Heavy Tobacco Smoker    Packs/day: 2.00    Types: Cigarettes  . Smokeless tobacco: Never Used  .  Alcohol use Yes     Comment: consumed 2 pints of "white liquor" this evening  . Drug use:     Types: "Crack" cocaine  . Sexual activity: Not Asked   Other Topics Concern  . None   Social History Narrative  . None    Hospital Course:    Danny Johnston is a 45 year old male with a history of depression,  mood instability, and substance use admitted for suicidal ideation in the context of treatment noncompliance, substance abuse, and approaching anniversary of his parents' death.  1. Suicidal ideation. Resolved. The patient is able to contract for safety. He is forward thinking and optimistic about the future.   2. Mood. In the past the patient responded well to a combination of Prozac and Abilify. We switched Prozac to to Effexor for depression for lack of progress and continued Abilify 10 mg for augmentation.  3. Alcohol detox. He completed Librium taper. This was uncomplicated detox.  4. Seizure disorder. We continued Depakote 500 mg twice daily.    5. Insomnia. Trazodone was available.  6. Smoking. Nicotine patch was available.  7. Substance abuse treatment. The patient was interested in treatment but [facility]   8. Metabolic syndrome monitoring. Lipid panel, TSH, and HgbA1C were normal.   9. EKG. Asymptomatic sinus bradycardia resolved.  10. Malaise. Medicine consult is greatly appreciated.  11. Ambulation. PT consult was completed. The patient does not need any equipment.  12. ECT. The patient spoke with Dr. Toni Amendlapacs about ECT. Depression has much improved. The patient not interested in ECT treatment.   13. Low Vitamin B12. We started supplementation.  14. Disposition. He was discharged to a new group home. PPD placed.  He will follow up with [facility].   Physical Findings: AIMS:  , ,  ,  ,    CIWA:  CIWA-Ar Total: 0 COWS:     Musculoskeletal: Strength & Muscle Tone: within normal limits Gait & Station: normal Patient leans: N/A  Psychiatric Specialty Exam: Physical Exam  Nursing note and vitals reviewed.   Review of Systems  Psychiatric/Behavioral: Positive for substance abuse.  All other systems reviewed and are negative.   Blood pressure 90/61, pulse 82, temperature 97.9 F (36.6 C), temperature source Oral, resp. rate 18,  height 6\' 3"  (1.905 m), weight 66.7 kg (147 lb), SpO2 100 %.Body mass index is 18.37 kg/m.  General Appearance: Casual  Eye Contact:  Good  Speech:  Clear and Coherent  Volume:  Normal  Mood:  Euthymic  Affect:  Appropriate  Thought Process:  Goal Directed and Descriptions of Associations: Intact  Orientation:  Full (Time, Place, and Person)  Thought Content:  WDL  Suicidal Thoughts:  No  Homicidal Thoughts:  No  Memory:  Immediate;   Fair Recent;   Fair Remote;   Fair  Judgement:  Impaired  Insight:  Shallow  Psychomotor Activity:  Normal  Concentration:  Concentration: Fair and Attention Span: Fair  Recall:  Fis[employer]Fair  Fund of Knowledge:  Fair  Language:  Fair  Akathisia:  No  Handed:  Right  AIMS (if indicated):     Assets:  Communication Skills Desire for Improvement Financial Resources/Insurance Housing Physical Health Resilience Social Support  ADL's:  Intact  Cognition:  WNL  Sleep:  Number of Hours: 6.3     Have you used any form of tobacco in the last 30 days? (Cigarettes, Smokeless Tobacco, Cigars, and/or Pipes): Yes  Has this patient used any form of  tobacco in the last 30 days? (Cigarettes, Smokeless Tobacco, Cigars, and/or Pipes) Yes, Yes, A prescription for an FDA-approved tobacco cessation medication was offered at discharge and the patient refused  Blood Alcohol level:  Lab Results  Component Value Date   ETH 214 (H) 09/09/2016    Metabolic Disorder Labs:  Lab Results  Component Value Date   HGBA1C 5.0 09/18/2016   MPG 97 09/18/2016   No results found for: PROLACTIN Lab Results  Component Value Date   CHOL 168 09/18/2016   TRIG 142 09/18/2016   HDL 64 09/18/2016   CHOLHDL 2.6 09/18/2016   VLDL 28 09/18/2016   LDLCALC 76 09/18/2016    See Psychiatric Specialty Exam and Suicide Risk Assessment completed by Attending Physician prior to discharge.  Discharge destination:  Other:  group home.  Is patient on multiple antipsychotic therapies at  discharge:  No   Has Patient had three or more failed trials of antipsychotic monotherapy by history:  No  Recommended Plan for Multiple Antipsychotic Therapies: NA  Discharge Instructions    Diet - low sodium heart healthy    Complete by:  As directed    Increase activity slowly    Complete by:  As directed        Medication List    STOP taking these medications   citalopram 40 MG tablet Commonly known as:  CELEXA   divalproex 500 MG 24 hr tablet Commonly known as:  DEPAKOTE ER Replaced by:  divalproex 500 MG DR tablet     TAKE these medications     Indication  ARIPiprazole 10 MG tablet Commonly known as:  ABILIFY Take 1 tablet (10 mg total) by mouth at bedtime. What changed:  medication strength  how much to take  when to take this  Indication:  Major Depressive Disorder   cyanocobalamin 1000 MCG tablet Take 1 tablet (1,000 mcg total) by mouth daily.  Indication:  Inadequate Vitamin B12   divalproex 500 MG DR tablet Commonly known as:  DEPAKOTE Take 1 tablet (500 mg total) by mouth every 12 (twelve) hours. Replaces:  divalproex 500 MG 24 hr tablet  Indication:  Complex Partial Epilepsy   traZODone 100 MG tablet Commonly known as:  DESYREL Take 1 tablet (100 mg total) by mouth at bedtime.  Indication:  Trouble Sleeping   venlafaxine XR 150 MG 24 hr capsule Commonly known as:  EFFEXOR-XR Take 1 capsule (150 mg total) by mouth daily with breakfast. Start taking on:  09/22/2016  Indication:  Major Depressive Disorder      Follow-up Information    [facility] Follow up.   Why:  Please arrive to the walk-in clinic between the hours of 8am-2:30pm for an assessment for medication management, substance abuse treatment and therapy.  Please call Unk PintoHarvey Bryant at [phone] for questions and assistance. Contact information: [facility] [address] [place] [address] Ph: [phone] Fax: [phone]          Follow-up  recommendations:  Activity:  As tolerated. Diet:  Low sodium heart healthy. Other:  keep follow-up appointments.  Comments:    Signed: Kristine LineaJolanta Chrisette Man, MD 09/21/2016, 11:15 AM

## 2016-09-21 NOTE — Progress Notes (Signed)
Recreation Therapy Notes  Date: 11.20.17 Time: 9:30 am Location: Craft Room  Group Topic: Self-expression  Goal Area(s) Addresses:  Patient will be able to identify a color that represents each emotion. Patient will verbalize benefit of using art as a means of self-expression. Patient will verbalize one emotion experienced while participating in activity.  Behavioral Response: Attentive, Interactive  Intervention: The Colors Within Me  Activity: Patients were given a blank face worksheet and were instructed to pick a color for each emotion they were feeling and show on the face how much of that emotion they are feeling.  Education: LRT educated patients on other forms of self-expression.  Education Outcome: Acknowledges education/In group clarification offered  Clinical Observations/Feedback: Patient drew face and colored to represent what emotion he felt. Patient contributed to group discussion by stating what emotions he was feeling and that he cannot be well if he is holding in his emotions.  Jacquelynn CreeGreene,Katreena Schupp M, LRT/CTRS 09/21/2016 10:21 AM

## 2016-09-21 NOTE — [facility] Group Notes (Signed)
[facility] Group Notes:  (Nursing/MHT/Case Management/Adjunct)  Date:  09/21/2016  Time:  4:46 AM  Type of Therapy:  Group Therapy  Participation Level:  Active  Participation Quality:  Appropriate  Affect:  Appropriate  Cognitive:  Appropriate  Insight:  Good and Improving  Engagement in Group:  Engaged  Modes of Intervention:  n/a  Summary of Progress/Problems:  Danny Johnston 09/21/2016, 4:46 AM

## 2016-09-21 NOTE — Tx Team (Signed)
Interdisciplinary Treatment and Diagnostic Plan Update  09/21/2016 Time of Session: 10:30am Danny RodneyGlenn L Monestime Jr. MRN: [id number]  Principal Diagnosis: Severe recurrent major depression without psychotic features (HCC)  Secondary Diagnoses: Principal Problem:   Severe recurrent major depression without psychotic features (HCC) Active Problems:   Suicidal ideation   Alcohol use disorder, moderate, dependence (HCC)   Cocaine use disorder, moderate, dependence (HCC)   Tobacco use disorder   Seizure disorder (HCC)   Vitamin B12 deficiency   Current Medications:  Current Facility-Administered Medications  Medication Dose Route Frequency Provider Last Rate Last Dose  . alum & mag hydroxide-simeth (MAALOX/MYLANTA) 200-200-20 MG/5ML suspension 30 mL  30 mL Oral Q4H PRN Audery AmelJohn T Clapacs, MD      . ARIPiprazole (ABILIFY) tablet 10 mg  10 mg Oral QHS Shari ProwsJolanta B Pucilowska, MD   10 mg at 09/20/16 2202  . divalproex (DEPAKOTE) DR tablet 500 mg  500 mg Oral Q12H [name], MD   500 mg at 09/21/16 0830  . folic acid (FOLVITE) tablet 1 mg  1 mg Oral Daily Audery AmelJohn T Clapacs, MD   1 mg at 09/21/16 0830  . magnesium hydroxide (MILK OF MAGNESIA) suspension 30 mL  30 mL Oral Daily PRN Audery AmelJohn T Clapacs, MD      . multivitamin with minerals tablet 1 tablet  1 tablet Oral Daily Audery AmelJohn T Clapacs, MD   1 tablet at 09/21/16 0830  . nicotine (NICODERM CQ - dosed in mg/24 hours) patch 21 mg  21 mg Transdermal Daily [name], MD      . traZODone (DESYREL) tablet 100 mg  100 mg Oral QHS Shari ProwsJolanta B Pucilowska, MD   100 mg at 09/20/16 2202  . venlafaxine XR (EFFEXOR-XR) 24 hr capsule 150 mg  150 mg Oral Q breakfast [name], MD   150 mg at 09/21/16 0830  . vitamin B-12 (CYANOCOBALAMIN) tablet 1,000 mcg  1,000 mcg Oral Daily Shari ProwsJolanta B Pucilowska, MD       Current Outpatient Prescriptions  Medication Sig Dispense Refill  . ARIPiprazole (ABILIFY) 10 MG tablet Take 1 tablet (10 mg total) by mouth  at bedtime. 30 tablet 1  . divalproex (DEPAKOTE) 500 MG DR tablet Take 1 tablet (500 mg total) by mouth every 12 (twelve) hours. 60 tablet 1  . traZODone (DESYREL) 100 MG tablet Take 1 tablet (100 mg total) by mouth at bedtime. 30 tablet 1  . [START ON 09/22/2016] venlafaxine XR (EFFEXOR-XR) 150 MG 24 hr capsule Take 1 capsule (150 mg total) by mouth daily with breakfast. 30 capsule 1  . vitamin B-12 1000 MCG tablet Take 1 tablet (1,000 mcg total) by mouth daily. 30 tablet 1   PTA Medications: No prescriptions prior to admission.    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   Physician Treatment Plan for Primary Diagnosis: Severe recurrent major depression without psychotic features (HCC) Long Term Goal(s): Improvement in symptoms so as ready for discharge Improvement in symptoms so as ready for discharge   Short Term Goals: Ability to identify changes in lifestyle to reduce recurrence of condition will improve Ability to verbalize feelings will improve Ability to disclose and discuss suicidal ideas Ability to demonstrate self-control will improve Ability to identify and develop effective coping behaviors will improve Ability to maintain clinical measurements within normal limits  will improve Compliance with prescribed medications will improve Ability to identify changes in lifestyle to reduce recurrence of condition will improve Ability to demonstrate self-control will improve Ability to identify triggers associated with substance abuse/mental health issues will improve  Medication Management: Evaluate patient's response, side effects, and tolerance of medication regimen.  Therapeutic Interventions: 1 to 1 sessions, Unit Group  sessions and Medication administration.  Evaluation of Outcomes: Adequate for discharge  Physician Treatment Plan for Secondary Diagnosis: Principal Problem:   Severe recurrent major depression without psychotic features (HCC) Active Problems:   Suicidal ideation   Alcohol use disorder, moderate, dependence (HCC)   Cocaine use disorder, moderate, dependence (HCC)   Tobacco use disorder   Seizure disorder (HCC)   Vitamin B12 deficiency  Long Term Goal(s): Improvement in symptoms so as ready for discharge Improvement in symptoms so as ready for discharge   Short Term Goals: Ability to identify changes in lifestyle to reduce recurrence of condition will improve Ability to verbalize feelings will improve Ability to disclose and discuss suicidal ideas Ability to demonstrate self-control will improve Ability to identify and develop effective coping behaviors will improve Ability to maintain clinical measurements within normal limits will improve Compliance with prescribed medications will improve Ability to identify changes in lifestyle to reduce recurrence of condition will improve Ability to demonstrate self-control will improve Ability to identify triggers associated with substance abuse/mental health issues will improve     Medication Management: Evaluate patient's response, side effects, and tolerance of medication regimen.  Therapeutic Interventions: 1 to 1 sessions, Unit Group sessions and Medication administration.  Evaluation of Outcomes: Adequate for discharge   RN Treatment Plan for Primary Diagnosis: Severe recurrent major depression without psychotic features (HCC) Long Term Goal(s): Knowledge of disease and therapeutic regimen to maintain health will improve  Short Term Goals: Ability to verbalize frustration and anger appropriately will improve, Ability to verbalize feelings will improve, Ability to identify and develop effective coping behaviors will improve and  Compliance with prescribed medications will improve  Medication Management: RN will administer medications as ordered by provider, will assess and evaluate patient's response and provide education to patient for prescribed medication. RN will report any adverse and/or side effects to prescribing provider.  Therapeutic Interventions: 1 on 1 counseling sessions, Psychoeducation, Medication administration, Evaluate responses to treatment, Monitor vital signs and CBGs as ordered, Perform/monitor CIWA, COWS, AIMS and Fall Risk screenings as ordered, Perform wound care treatments as ordered.  Evaluation of Outcomes: Adequate for discharge   LCSW Treatment Plan for Primary Diagnosis: Severe recurrent major depression without psychotic features (HCC) Long Term Goal(s): Safe transition to appropriate next level of care at discharge, Engage patient in therapeutic group addressing interpersonal concerns.  Short Term Goals: Engage patient in aftercare planning with referrals and resources, Increase social support, Increase ability to appropriately verbalize feelings, Increase emotional regulation, Facilitate acceptance of mental health diagnosis and concerns, Facilitate patient progression through stages of change regarding substance use diagnoses and concerns, Identify triggers associated with mental health/substance abuse issues and Increase skills for wellness and recovery  Therapeutic Interventions: Assess for all discharge needs, 1 to 1 time with Social worker, Explore available resources and support systems, Assess for adequacy in community support network, Educate family and significant other(s) on suicide prevention, Complete Psychosocial Assessment, Interpersonal group therapy.  Evaluation of Outcomes: Adequate for discharge   Progress in Treatment: Attending groups: Yes.  Participating in groups: No. Taking medication as prescribed: Yes. Toleration medication: Yes. Family/Significant other  contact made:  No, will contact:  pt's family once permission is given by pt Patient understands diagnosis: Yes. Discussing patient identified problems/goals with staff: Yes. Medical problems stabilized or resolved: Yes. Denies suicidal/homicidal ideation: Yes. Issues/concerns per patient self-inventory: No. Other: none  New problem(s) identified: No, Describe:  none listed  New Short Term/Long Term Goal(s):  Discharge Plan or Barriers: [name] will discharge to [facility] in [place] and will follow up with [facility] for for medication management, substance abuse treatment and therapy.    Reason for Continuation of Hospitalization: Aggression Anxiety Depression Homicidal ideation Mania Suicidal ideation Withdrawal symptoms  Estimated Length of Stay: 3-5 days  Attendees: Patient: Danny Johnston 09/17/2016 10:56 AM  Physician: Dr. Jennet Maduro, MD 09/17/2016 10:56 AM  Nursing: Hulan Amato, RN 09/17/2016 10:56 AM  RN Care Manager: 09/17/2016 10:56 AM  Social Worker: Dorothe Pea. Roylene Reason, LCAS 09/17/2016 10:56 AM  Recreational Therapist: Hershal Coria, LRT 09/17/2016 10:56 AM  Social Worker: Jake Shark, LCSW 09/17/2016 10:56 AM  Other:  09/17/2016 10:56 AM  Other: 09/17/2016 10:56 AM    Scribe for Treatment Team: Dorothe Pea Xai Frerking, LCSWA 09/17/2016 10:56 AM  Updated 09/21/16 by Dorothe Pea. Dejai Schubach, LCSWA, LCAS

## 2016-09-21 NOTE — [facility] Suicide Risk Assessment (Signed)
[facility]   Principal Problem: Severe recurrent major depression without psychotic features [facility](HCC) Discharge Diagnoses:  Patient Active Problem List   Diagnosis Date Noted  . Tobacco use disorder [F17.200] 09/11/2016  . Seizure disorder (HCC) [G40.909] 09/11/2016  . Severe recurrent major depression without psychotic features (HCC) [F33.2] 09/10/2016  . Suicidal ideation [R45.851] 09/10/2016  . Alcohol use disorder, moderate, dependence (HCC) [F10.20] 09/10/2016  . Cocaine use disorder, moderate, dependence (HCC) [F14.20] 09/10/2016    Total Time spent with patient: 30 minutes  Musculoskeletal: Strength & Muscle Tone: within normal limits Gait & Station: normal Patient leans: N/A  Psychiatric Specialty Exam: Review of Systems  Psychiatric/Behavioral: Positive for substance abuse.  All other systems reviewed and are negative.   Blood pressure 90/61, pulse 82, temperature 97.9 F (36.6 C), temperature source Oral, resp. rate 18, height 6\' 3"  (1.905 m), weight 66.7 kg (147 lb), SpO2 100 %.Body mass index is 18.37 kg/m.  General Appearance: Casual  Eye Contact::  Good  Speech:  Clear and Coherent409  Volume:  Normal  Mood:  Euthymic  Affect:  Appropriate  Thought Process:  Goal Directed and Descriptions of Associations: Intact  Orientation:  Full (Time, Place, and Person)  Thought Content:  WDL  Suicidal Thoughts:  No  Homicidal Thoughts:  No  Memory:  Immediate;   Fair Recent;   Fair Remote;   Fair  Judgement:  Impaired  Insight:  Shallow  Psychomotor Activity:  Normal  Concentration:  Fair  Recall:  Fis[employer]Fair  Fund of Knowledge:Fair  Language: Fair  Akathisia:  No  Handed:  Right  AIMS (if indicated):     Assets:  Communication Skills Desire for Improvement Financial Resources/Insurance Housing Resilience Social Support  Sleep:  Number of Hours: 6.3  Cognition: WNL  ADL's:  Intact   Mental Status Per Nursing Assessment::   On  Admission:  Suicidal ideation indicated by patient, Self-harm thoughts  Demographic Factors:  Male  Loss Factors: Financial problems/change in socioeconomic status  Historical Factors: Prior suicide attempts, Family history of mental illness or substance abuse and Impulsivity  Risk Reduction Factors:   Sense of responsibility to family  Continued Clinical Symptoms:  Depression:   Comorbid alcohol abuse/dependence Impulsivity Alcohol/Substance Abuse/Dependencies  Cognitive Features That Contribute To Risk:  None    Suicide Risk:  Minimal: No identifiable suicidal ideation.  Patients presenting with no risk factors but with morbid ruminations; may be classified as minimal risk based on the severity of the depressive symptoms  Follow-up Information    [facility] Follow up.   Why:  Please arrive to the walk-in clinic between the hours of 8am-2:30pm for an assessment for medication management, substance abuse treatment and therapy.  Please call Unk PintoHarvey Bryant at [phone] for questions and assistance. Contact information: [facility]  [address] [place] [address] Ph: [phone] Fax: [phone]          Plan Of Care/Follow-up recommendations:  Activity:  As tolerated. Diet:  Low sodium heart healthy. Other:  Keep follow-up appointments.  Kristine LineaJolanta Reginold Beale, MD 09/21/2016, 10:35 AM

## 2018-09-18 ENCOUNTER — Emergency Department: Payer: Medicaid Other

## 2018-09-18 ENCOUNTER — Other Ambulatory Visit: Payer: Self-pay

## 2018-09-18 ENCOUNTER — Emergency Department
Admission: EM | Admit: 2018-09-18 | Discharge: 2018-09-18 | Disposition: A | Discharge status: Home / Self Care | Payer: Medicaid Other | Attending: Emergency Medicine | Admitting: Emergency Medicine

## 2018-09-18 DIAGNOSIS — F17210 Nicotine dependence, cigarettes, uncomplicated: Secondary | ICD-10-CM | POA: Diagnosis not present

## 2018-09-18 DIAGNOSIS — E785 Hyperlipidemia, unspecified: Secondary | ICD-10-CM | POA: Insufficient documentation

## 2018-09-18 DIAGNOSIS — M5441 Lumbago with sciatica, right side: Secondary | ICD-10-CM | POA: Diagnosis not present

## 2018-09-18 DIAGNOSIS — M545 Low back pain: Secondary | ICD-10-CM | POA: Diagnosis present

## 2018-09-18 HISTORY — DX: Hyperlipidemia, unspecified: E78.5

## 2018-09-18 MED ORDER — CYCLOBENZAPRINE HCL 10 MG PO TABS
5.0000 mg | ORAL_TABLET | Freq: Once | ORAL | Status: DC
  Filled 2018-09-18: qty 1

## 2018-09-18 MED ORDER — CYCLOBENZAPRINE HCL 5 MG PO TABS: 5.0000 mg | ORAL_TABLET | Freq: Three times a day (TID) | ORAL | 0 refills | Status: AC | PRN

## 2018-09-18 MED ORDER — LIDOCAINE 5 % EX PTCH
1.0000 | MEDICATED_PATCH | CUTANEOUS | Status: DC
  Administered 2018-09-18: 1 via TRANSDERMAL
  Filled 2018-09-18: qty 1

## 2018-09-18 MED ORDER — IBUPROFEN 600 MG PO TABS
600.0000 mg | ORAL_TABLET | Freq: Once | ORAL | Status: AC
  Administered 2018-09-18: 600 mg via ORAL
  Filled 2018-09-18: qty 1

## 2018-09-18 MED ORDER — PREDNISONE 20 MG PO TABS
60.0000 mg | ORAL_TABLET | Freq: Once | ORAL | Status: AC
  Administered 2018-09-18: 60 mg via ORAL
  Filled 2018-09-18: qty 3

## 2018-09-18 MED ORDER — PREDNISONE 10 MG PO TABS: ORAL_TABLET | ORAL | 0 refills | Status: AC

## 2018-09-18 MED ORDER — LIDOCAINE 5 % EX PTCH: 1.0000 | MEDICATED_PATCH | CUTANEOUS | 0 refills | Status: AC

## 2018-09-18 MED ORDER — OXYCODONE HCL 5 MG PO TABS
5.0000 mg | ORAL_TABLET | Freq: Once | ORAL | Status: AC
  Administered 2018-09-18: 5 mg via ORAL
  Filled 2018-09-18: qty 1

## 2018-09-18 MED ORDER — ORPHENADRINE CITRATE 30 MG/ML IJ SOLN
60.0000 mg | Freq: Two times a day (BID) | INTRAMUSCULAR | Status: DC
  Filled 2018-09-18: qty 2

## 2018-09-18 MED ORDER — IBUPROFEN 600 MG PO TABS: 600.0000 mg | ORAL_TABLET | Freq: Four times a day (QID) | ORAL | 0 refills | Status: DC | PRN

## 2018-09-18 NOTE — ED Triage Notes (Signed)
Pt c/o right lower back pain since yesterday, states he strained his back lifting a couch.

## 2018-09-18 NOTE — ED Provider Notes (Signed)
**Note Danny-Identified via Obfuscation** [facility] Emergency Department Provider Note  ____________________________________________  Time seen: Approximately 5:36 PM  I have reviewed the triage vital signs and the nursing notes.   HISTORY  Chief Complaint Back Pain    HPI Danny Jaworski. is a 47 y.o. male that presents the emergency department for evaluation of low back pain that radiates into right leg since yesterday.  Patient was lifting a couch when sharp pain started.  Certain movements send shooting pains down the back of his right leg.  Leg does not feel weak. He is able to walk but with pain. No bowel or bladder dysfunction or saddle anesthesias.  No additional injuries.  This happened once previously.  No numbness, tingling.  Past Medical History:  Diagnosis Date  . Hyperlipemia   . Seizures [facility])     Patient Active Problem List   Diagnosis Date Noted  . Vitamin B12 deficiency 09/21/2016  . Tobacco use disorder 09/11/2016  . Seizure disorder (HCC) 09/11/2016  . Severe recurrent major depression without psychotic features (HCC) 09/10/2016  . Suicidal ideation 09/10/2016  . Alcohol use disorder, moderate, dependence (HCC) 09/10/2016  . Cocaine use disorder, moderate, dependence (HCC) 09/10/2016    Past Surgical History:  Procedure Laterality Date  . APPENDECTOMY      Prior to Admission medications   Medication Sig Start Date End Date Taking? Authorizing Provider  ARIPiprazole (ABILIFY) 10 MG tablet Take 1 tablet (10 mg total) by mouth at bedtime. 09/21/16   Pucilowska, Braulio Conte B, MD  cyclobenzaprine (FLEXERIL) 5 MG tablet Take 1 tablet (5 mg total) by mouth 3 (three) times daily as needed for up to 7 days for muscle spasms. 09/18/18 09/25/18  Enid Derry, PA-C  divalproex (DEPAKOTE) 500 MG DR tablet Take 1 tablet (500 mg total) by mouth every 12 (twelve) hours. 09/21/16   Pucilowska, Braulio Conte B, MD  ibuprofen (ADVIL,MOTRIN) 600 MG tablet Take 1 tablet (600 mg total) by mouth  every 6 (six) hours as needed. 09/18/18   Enid Derry, PA-C  lidocaine (LIDODERM) 5 % Place 1 patch onto the skin daily. Remove & Discard patch within 12 hours or as directed by MD 09/18/18   Enid Derry, PA-C  predniSONE (DELTASONE) 10 MG tablet Take 6 tablets on day 1, take 5 tablets on day 2, take 4 tablets on day 3, take 3 tablets on day 4, take 2 tablets on day 5, take 1 tablet on day 6 09/18/18   Enid Derry, PA-C  traZODone (DESYREL) 100 MG tablet Take 1 tablet (100 mg total) by mouth at bedtime. 09/21/16   Pucilowska, Braulio Conte B, MD  venlafaxine XR (EFFEXOR-XR) 150 MG 24 hr capsule Take 1 capsule (150 mg total) by mouth daily with breakfast. 09/22/16   Pucilowska, Jolanta B, MD  vitamin B-12 1000 MCG tablet Take 1 tablet (1,000 mcg total) by mouth daily. 09/21/16   Pucilowska, Ellin Goodie, MD    Allergies Acetaminophen and Aspirin  No family history on file.  Social History Social History   Tobacco Use  . Smoking status: Heavy Tobacco Smoker    Packs/day: 2.00    Types: Cigarettes  . Smokeless tobacco: Never Used  Substance Use Topics  . Alcohol use: Yes    Comment: consumed 2 pints of "white liquor" this evening  . Drug use: Yes    Types: "Crack" cocaine     Review of Systems  Constitutional: No fever/chills Gastrointestinal: No abdominal pain.  No nausea, no vomiting.  Musculoskeletal: Positive for back  pain. Skin: Negative for rash, abrasions, lacerations, ecchymosis. Neurological: Negative for numbness or tingling   ____________________________________________   PHYSICAL EXAM:  VITAL SIGNS: ED Triage Vitals  Enc Vitals Group     BP 09/18/18 1558 129/76     Pulse Rate 09/18/18 1558 81     Resp 09/18/18 1558 16     Temp 09/18/18 1558 97.7 F (36.5 C)     Temp Source 09/18/18 1558 Oral     SpO2 09/18/18 1558 98 %     Weight 09/18/18 1559 234 lb (106.1 kg)     Height 09/18/18 1559 6\' 2"  (1.88 m)     Head Circumference --      Peak Flow --       Pain Score 09/18/18 1559 10     Pain Loc --      Pain Edu? --      Excl. in GC? --      Constitutional: Alert and oriented. Well appearing and in no acute distress. Eyes: Conjunctivae are normal. PERRL. EOMI. Head: Atraumatic. ENT:      Ears:      Nose: No congestion/rhinnorhea.      Mouth/Throat: Mucous membranes are moist.  Neck: No stridor. Cardiovascular: Normal rate, regular rhythm.  Good peripheral circulation. Respiratory: Normal respiratory effort without tachypnea or retractions. Lungs CTAB. Good air entry to the bases with no decreased or absent breath sounds. Musculoskeletal: Full range of motion to all extremities. No gross deformities appreciated.  Tenderness to palpation to superior lumbar spine and lumbar paraspinal muscles.  Positive straight leg raise.  Strength and sensation equal in lower extremities bilaterally.  No foot drop.  Slow gait. Neurologic:  Normal speech and language. No gross focal neurologic deficits are appreciated.  Skin:  Skin is warm, dry and intact. No rash noted. Psychiatric: Mood and affect are normal. Speech and behavior are normal. Patient exhibits appropriate insight and judgement.   ____________________________________________   LABS (all labs ordered are listed, but only abnormal results are displayed)  Labs Reviewed - No data to display ____________________________________________  EKG   ____________________________________________  RADIOLOGY Lexine BatonI, Danny Johnston, personally viewed and evaluated these images (plain radiographs) as part of my medical decision making, as well as reviewing the written report by the radiologist.  Dg Lumbar Spine 2-3 Views  Result Date: 09/18/2018 CLINICAL DATA:  Back injury.  Lifting heavy object. EXAM: LUMBAR SPINE - 2-3 VIEW COMPARISON:  None. FINDINGS: Degenerative disc disease at L4-5 with disc space narrowing and vacuum disc. Anterior spurring. Mild degenerative facet disease in the lower lumbar  spine. No fracture or subluxation. IMPRESSION: Degenerative disc and facet disease in the lower lumbar spine. No acute bony abnormality. Electronically Signed   By: Charlett NoseKevin  Dover M.D.   On: 09/18/2018 17:39    ____________________________________________    PROCEDURES  Procedure(s) performed:    Procedures    Medications  lidocaine (LIDODERM) 5 % 1 patch (1 patch Transdermal Patch Applied 09/18/18 1839)  oxyCODONE (Oxy IR/ROXICODONE) immediate release tablet 5 mg (5 mg Oral Given 09/18/18 1716)  predniSONE (DELTASONE) tablet 60 mg (60 mg Oral Given 09/18/18 1837)  ibuprofen (ADVIL,MOTRIN) tablet 600 mg (600 mg Oral Given 09/18/18 1839)     ____________________________________________   INITIAL IMPRESSION / ASSESSMENT AND PLAN / ED COURSE  Pertinent labs & imaging results that were available during my care of the patient were reviewed by me and considered in my medical decision making (see chart for details).  Review of the [place] CSRS  was performed in accordance of the [facility] prior to dispensing any controlled drugs.   Patient presented to the emergency department for evaluation of low back pain that started after moving a couch yesterday.  Vital signs and exam are reassuring.  Exam is consistent with lumbar radiculopathy.  X-ray consistent with degenerative changes.  Pain improved significantly with Percocet.  Patient will be discharged home with prescriptions for prednisone, Flexeril, ibuprofen, Lidoderm. Patient is to follow up with primary care as directed. Patient is given ED precautions to return to the ED for any worsening or new symptoms.     ____________________________________________  FINAL CLINICAL IMPRESSION(S) / ED DIAGNOSES  Final diagnoses:  Acute right-sided low back pain with right-sided sciatica      NEW MEDICATIONS STARTED DURING THIS VISIT:  ED Discharge Orders         Ordered    cyclobenzaprine (FLEXERIL) 5 MG tablet  3 times daily PRN     09/18/18  1826    ibuprofen (ADVIL,MOTRIN) 600 MG tablet  Every 6 hours PRN     09/18/18 1826    predniSONE (DELTASONE) 10 MG tablet     09/18/18 1826    lidocaine (LIDODERM) 5 %  Every 24 hours     09/18/18 1826              This chart was dictated using voice recognition software/Dragon. Despite best efforts to proofread, errors can occur which can change the meaning. Any change was purely unintentional.    Enid Derry, PA-C 09/18/18 1916    Sharman Cheek, MD 09/18/18 2024

## 2019-11-27 ENCOUNTER — Emergency Department: Payer: Medicaid Other

## 2019-11-27 ENCOUNTER — Emergency Department
Admission: EM | Admit: 2019-11-27 | Discharge: 2019-11-28 | Disposition: A | Discharge status: Home / Self Care | Payer: Medicaid Other | Attending: Internal Medicine | Admitting: Internal Medicine

## 2019-11-27 ENCOUNTER — Other Ambulatory Visit: Payer: Self-pay

## 2019-11-27 DIAGNOSIS — F17210 Nicotine dependence, cigarettes, uncomplicated: Secondary | ICD-10-CM | POA: Diagnosis not present

## 2019-11-27 DIAGNOSIS — R569 Unspecified convulsions: Secondary | ICD-10-CM

## 2019-11-27 DIAGNOSIS — E876 Hypokalemia: Secondary | ICD-10-CM

## 2019-11-27 DIAGNOSIS — F1490 Cocaine use, unspecified, uncomplicated: Secondary | ICD-10-CM

## 2019-11-27 DIAGNOSIS — Z9114 Patient's other noncompliance with medication regimen: Secondary | ICD-10-CM | POA: Insufficient documentation

## 2019-11-27 DIAGNOSIS — F1290 Cannabis use, unspecified, uncomplicated: Secondary | ICD-10-CM

## 2019-11-27 LAB — COMPREHENSIVE METABOLIC PANEL
ALT: 12 U/L (ref 0–44)
AST: 18 U/L (ref 15–41)
Albumin: 3.8 g/dL (ref 3.5–5.0)
Alkaline Phosphatase: 58 U/L (ref 38–126)
Anion gap: 13 (ref 5–15)
BUN: 10 mg/dL (ref 6–20)
CO2: 22 mmol/L (ref 22–32)
Calcium: 8.7 mg/dL — ABNORMAL LOW (ref 8.9–10.3)
Chloride: 101 mmol/L (ref 98–111)
Creatinine, Ser: 1.15 mg/dL (ref 0.61–1.24)
GFR calc Af Amer: >60 mL/min (ref >60)
GFR calc non Af Amer: >60 mL/min (ref >60)
Glucose, Bld: 120 mg/dL — ABNORMAL HIGH (ref 70–99)
Potassium: 2.9 mmol/L — ABNORMAL LOW (ref 3.5–5.1)
Sodium: 136 mmol/L (ref 135–145)
Total Bilirubin: 0.8 mg/dL (ref 0.3–1.2)
Total Protein: 7.3 g/dL (ref 6.5–8.1)

## 2019-11-27 LAB — CBC WITH DIFFERENTIAL/PLATELET
Abs Immature Granulocytes: 0.04 10*3/uL (ref 0.00–0.07)
Basophils Absolute: 0 10*3/uL (ref 0.0–0.1)
Basophils Relative: 0 %
Eosinophils Absolute: 0 10*3/uL (ref 0.0–0.5)
Eosinophils Relative: 0 %
HCT: 37.9 % — ABNORMAL LOW (ref 39.0–52.0)
Hemoglobin: 12.8 g/dL — ABNORMAL LOW (ref 13.0–17.0)
Immature Granulocytes: 0 %
Lymphocytes Relative: 39 %
Lymphs Abs: 5.1 10*3/uL — ABNORMAL HIGH (ref 0.7–4.0)
MCH: 31.4 pg (ref 26.0–34.0)
MCHC: 33.8 g/dL (ref 30.0–36.0)
MCV: 92.9 fL (ref 80.0–100.0)
Monocytes Absolute: 1 10*3/uL (ref 0.1–1.0)
Monocytes Relative: 8 %
Neutro Abs: 6.9 10*3/uL (ref 1.7–7.7)
Neutrophils Relative %: 53 %
Platelets: 165 10*3/uL (ref 150–400)
RBC: 4.08 MIL/uL — ABNORMAL LOW (ref 4.22–5.81)
RDW: 13.2 % (ref 11.5–15.5)
WBC: 13 10*3/uL — ABNORMAL HIGH (ref 4.0–10.5)
nRBC: 0 % (ref 0.0–0.2)

## 2019-11-27 LAB — VALPROIC ACID LEVEL: Valproic Acid Lvl: 11 ug/mL — ABNORMAL LOW (ref 50.0–100.0)

## 2019-11-27 LAB — ETHANOL: Alcohol, Ethyl (B): 14 mg/dL — ABNORMAL HIGH (ref <10)

## 2019-11-27 MED ORDER — LEVETIRACETAM IN NACL 500 MG/100ML IV SOLN
500.0000 mg | Freq: Once | INTRAVENOUS | Status: AC
  Administered 2019-11-27: 23:00:00 500 mg via INTRAVENOUS
  Filled 2019-11-27: qty 100

## 2019-11-27 MED ORDER — VALPROATE SODIUM 500 MG/5ML IV SOLN
500.0000 mg | Freq: Once | INTRAVENOUS | Status: AC
  Administered 2019-11-28: 500 mg via INTRAVENOUS
  Filled 2019-11-27: qty 5

## 2019-11-27 NOTE — ED Notes (Signed)
Patient to CT.

## 2019-11-27 NOTE — ED Notes (Signed)
Pt presents to ED following a witnessed seizure lasting 1 minute. Pt has a hx of seizures and it was reported he did not take his medication today.

## 2019-11-27 NOTE — ED Provider Notes (Addendum)
[facility] Emergency Department Provider Note       Time seen: ----------------------------------------- 10:14 PM on 11/27/2019 ----------------------------------------- Level V caveat: History/ROS limited by altered mental status  I have reviewed the triage vital signs and the nursing notes.  HISTORY   Chief Complaint No chief complaint on file.   HPI Danny Johnston. is a 49 y.o. male with a history of hyperlipidemia and seizures who presents to the ED for a seizure.  Patient reportedly did not take his seizure medication today but opted to you smoke marijuana instead.  He arrives postictal and cannot give further review of systems or report.  Past Medical History:  Diagnosis Date  . Hyperlipemia   . Seizures [facility])     Patient Active Problem List   Diagnosis Date Noted  . Vitamin B12 deficiency 09/21/2016  . Tobacco use disorder 09/11/2016  . Seizure disorder (HCC) 09/11/2016  . Severe recurrent major depression without psychotic features (HCC) 09/10/2016  . Suicidal ideation 09/10/2016  . Alcohol use disorder, moderate, dependence (HCC) 09/10/2016  . Cocaine use disorder, moderate, dependence (HCC) 09/10/2016    Past Surgical History:  Procedure Laterality Date  . APPENDECTOMY      Allergies Acetaminophen and Aspirin  Social History Social History   Tobacco Use  . Smoking status: Heavy Tobacco Smoker    Packs/day: 2.00    Types: Cigarettes  . Smokeless tobacco: Never Used  Substance Use Topics  . Alcohol use: Yes    Comment: consumed 2 pints of "white liquor" this evening  . Drug use: Yes    Types: "Crack" cocaine   Review of Systems Unknown positive for recent seizure and postictal state  All systems negative/normal/unremarkable except as stated in the HPI  ____________________________________________   PHYSICAL EXAM:  VITAL SIGNS: ED Triage Vitals  Enc Vitals Group     BP      Pulse      Resp      Temp    Temp src      SpO2      Weight      Height      Head Circumference      Peak Flow      Pain Score      Pain Loc      Pain Edu?      Excl. in GC?     Constitutional: Alert but disoriented, no obvious distress Eyes: Conjunctivae are normal. Normal extraocular movements. ENT      Head: Normocephalic and atraumatic.      Nose: No congestion/rhinnorhea.      Mouth/Throat: Mucous membranes are moist.      Neck: No stridor. Cardiovascular: Normal rate, regular rhythm. No murmurs, rubs, or gallops. Respiratory: Normal respiratory effort without tachypnea nor retractions. Breath sounds are clear and equal bilaterally. No wheezes/rales/rhonchi. Gastrointestinal: Soft and nontender. Normal bowel sounds Musculoskeletal: Nontender with normal range of motion in extremities. No lower extremity tenderness nor edema. Neurologic:   No gross focal neurologic deficits are appreciated.  Patient is nonverbal at this time Skin:  Skin is warm, dry and intact. No rash noted. Psychiatric: Flat affect ___________________________________________  ED COURSE:  As part of my medical decision making, I reviewed the following data within the electronic MEDICAL RECORD NUMBER History obtained from family if available, nursing notes, old chart and ekg, as well as notes from prior ED visits. Patient presented for a seizure, we will assess with labs and imaging as indicated at this time.  Procedures  Ray[place]ound Katich. was evaluated in Emergency Department on 11/27/2019 for the symptoms described in the history of present illness. He was evaluated in the context of the global COVID-19 pandemic, which necessitated consideration that the patient might be at risk for infection with the SARS-CoV-2 virus that causes COVID-19. Institutional protocols and algorithms that pertain to the evaluation of patients at risk for COVID-19 are in a state of rapid change based on information released by regulatory bodies including the CDC and  federal and state organizations. These policies and algorithms were followed during the patient's care in the ED.   EKG: Interpreted by me, sinus rhythm rate of 71 bpm, normal PR interval, normal QRS, normal QT ____________________________________________   LABS (pertinent positives/negatives)  Labs Reviewed  CBC WITH DIFFERENTIAL/PLATELET - Abnormal; Notable for the following components:      Result Value   WBC 13.0 (*)    RBC 4.08 (*)    Hemoglobin 12.8 (*)    HCT 37.9 (*)    Lymphs Abs 5.1 (*)    All other components within normal limits  COMPREHENSIVE METABOLIC PANEL - Abnormal; Notable for the following components:   Potassium 2.9 (*)    Glucose, Bld 120 (*)    Calcium 8.7 (*)    All other components within normal limits  VALPROIC ACID LEVEL - Abnormal; Notable for the following components:   Valproic Acid Lvl 11 (*)    All other components within normal limits  ETHANOL - Abnormal; Notable for the following components:   Alcohol, Ethyl (B) 14 (*)    All other components within normal limits  URINALYSIS, COMPLETE (UACMP) WITH MICROSCOPIC  URINE DRUG SCREEN, QUALITATIVE ([facility] ONLY)   ____________________________________________   DIFFERENTIAL DIAGNOSIS   Seizure, overdose, substance abuse, intoxication, medication noncompliance  FINAL ASSESSMENT AND PLAN  Seizure, medication noncompliance   Plan: The patient had presented for a seizure which we expect from noncompliance.  He will need a dose of seizure medicine here.  I have ordered an IV dose of Depakote for him.  Final disposition is pending at this time.   Laurence Aly, MD    Note: This note was generated in part or whole with voice recognition software. Voice recognition is usually quite accurate but there are transcription errors that can and very often do occur. I apologize for any typographical errors that were not detected and corrected.     Earleen Newport, MD 11/27/19 2218    Earleen Newport, MD 11/27/19 2229    Earleen Newport, MD 11/27/19 [phone]

## 2019-11-27 NOTE — ED Triage Notes (Signed)
Pt to ED via EMS from home. Per ems pt had 1 witnessed seizure. Pt arrives awake but non verbal or responsive to voice or pain. Pt has hx of seizure, did not take medication today. Pt was using alcohol and marijuana prior to seizure. VSS at this time.

## 2019-11-27 NOTE — ED Notes (Signed)
Pt resting in bed at this time. VS stable. Chest rise/fall observed.

## 2019-11-27 NOTE — ED Provider Notes (Signed)
-----------------------------------------   11:46 PM on 11/27/2019 -----------------------------------------  Care assumed from Dr. Mayford Knife.  Patient returned from CT scan and is resting in no acute distress.  Suspect IV Keppra ordered as IV Depakote is on national back order.  However, will give a dose here as this is what patient is supposed to take.  Will continue to monitor in the ED.   ----------------------------------------- 12:41 AM on 11/28/2019 -----------------------------------------  Patient starting to wake up a little.  Able to tell me his name and follow simple commands.  Will continue to monitor.   ----------------------------------------- 6:15 AM on 11/28/2019 -----------------------------------------  No events overnight.  Patient slept all night.  Waking up now.  Able to talk to me. MAEx4. Will provide food tray.  Will be able to discharge home once he can secure a ride.  States he still has seizure medicines at home.  Strict return precautions given.  Patient verbalizes understanding and agrees with plan of care.   ----------------------------------------- 6:39 AM on 11/28/2019 -----------------------------------------  Patient has eaten a sandwich tray.  Remains awake and alert.  Will call family for a ride.   Irean Hong, MD 11/28/19 [phone]

## 2019-11-28 LAB — URINE DRUG SCREEN, QUALITATIVE (ARMC ONLY)
Amphetamines, Ur Screen: NOT DETECTED
Barbiturates, Ur Screen: NOT DETECTED
Benzodiazepine, Ur Scrn: NOT DETECTED
Cannabinoid 50 Ng, Ur ~~LOC~~: POSITIVE — AB
Cocaine Metabolite,Ur ~~LOC~~: POSITIVE — AB
MDMA (Ecstasy)Ur Screen: NOT DETECTED
Methadone Scn, Ur: NOT DETECTED
Opiate, Ur Screen: NOT DETECTED
Phencyclidine (PCP) Ur S: NOT DETECTED
Tricyclic, Ur Screen: NOT DETECTED

## 2019-11-28 LAB — URINALYSIS, COMPLETE (UACMP) WITH MICROSCOPIC
Bacteria, UA: NONE SEEN
Bilirubin Urine: NEGATIVE
Glucose, UA: NEGATIVE mg/dL
Hgb urine dipstick: NEGATIVE
Ketones, ur: NEGATIVE mg/dL
Leukocytes,Ua: NEGATIVE
Nitrite: NEGATIVE
Protein, ur: NEGATIVE mg/dL
Specific Gravity, Urine: 1.006 (ref 1.005–1.030)
Squamous Epithelial / LPF: NONE SEEN (ref 0–5)
pH: 6 (ref 5.0–8.0)

## 2019-11-28 MED ORDER — POTASSIUM CHLORIDE CRYS ER 20 MEQ PO TBCR
40.0000 meq | EXTENDED_RELEASE_TABLET | Freq: Once | ORAL | Status: AC
  Administered 2019-11-28: 40 meq via ORAL
  Filled 2019-11-28: qty 2

## 2019-11-28 NOTE — ED Notes (Signed)
Patient remains asleep, NAD. Vitals stable

## 2019-11-28 NOTE — ED Notes (Signed)
Patient currently responding to questions, can nod head or shake. Asked why he didn't take his meds, he states "he just forgot"

## 2019-11-28 NOTE — ED Notes (Signed)
Patient resting.

## 2019-11-28 NOTE — ED Notes (Signed)
Attempted to call son. No answer

## 2019-11-28 NOTE — ED Notes (Signed)
Patient called girlfriend who is calling cousin to come pick patient up

## 2019-11-28 NOTE — ED Notes (Signed)
Patient still sleeping. Patient's girlfriend called this RN, girlfriend is not on the chart as a contact and thus I could not give her any information. Informed to call family for information

## 2019-11-28 NOTE — ED Notes (Signed)
Topaz not working, unable to have pt sign.  Pt remains alert and oriented. Verbalized understanding of discharge

## 2019-11-28 NOTE — Discharge Instructions (Signed)
1.  You must take your seizure medicines every day. 2.  Do not drink alcohol or use drugs.  This will lead to seizures. 3.  Return to the ER for worsening symptoms, persistent vomiting, difficulty breathing or other concerns.

## 2019-11-28 NOTE — ED Notes (Signed)
Family called and brother in law will pick up [phone]. Pt will remain in room until ride here.

## 2019-11-28 NOTE — ED Notes (Signed)
Patient is awake, eating and drinking

## 2019-11-28 NOTE — ED Notes (Signed)
Awaiting depacon from pharmacy

## 2019-11-28 NOTE — ED Notes (Signed)
Pt is now alert and oriented. Pt has eaten meal.  Uses walker at baseline, unable to send via bus pass as pt does not have walker with him.  Cannot reach any family to give pt ride.  Spoke with Geologist, engineering and will send pt home via cab. Dan Humphreys is at pts house.

## 2020-04-28 ENCOUNTER — Emergency Department: Payer: Medicaid Other

## 2020-04-28 ENCOUNTER — Other Ambulatory Visit: Payer: Self-pay

## 2020-04-28 ENCOUNTER — Emergency Department
Admission: EM | Admit: 2020-04-28 | Discharge: 2020-04-28 | Disposition: A | Discharge status: Home / Self Care | Payer: Medicaid Other | Attending: Emergency Medicine | Admitting: Emergency Medicine

## 2020-04-28 DIAGNOSIS — F17210 Nicotine dependence, cigarettes, uncomplicated: Secondary | ICD-10-CM | POA: Diagnosis not present

## 2020-04-28 DIAGNOSIS — K047 Periapical abscess without sinus: Secondary | ICD-10-CM | POA: Insufficient documentation

## 2020-04-28 DIAGNOSIS — F1290 Cannabis use, unspecified, uncomplicated: Secondary | ICD-10-CM | POA: Insufficient documentation

## 2020-04-28 LAB — BASIC METABOLIC PANEL
Anion gap: 9 (ref 5–15)
BUN: 13 mg/dL (ref 6–20)
CO2: 26 mmol/L (ref 22–32)
Calcium: 9 mg/dL (ref 8.9–10.3)
Chloride: 103 mmol/L (ref 98–111)
Creatinine, Ser: 0.92 mg/dL (ref 0.61–1.24)
GFR calc Af Amer: >60 mL/min (ref >60)
GFR calc non Af Amer: >60 mL/min (ref >60)
Glucose, Bld: 93 mg/dL (ref 70–99)
Potassium: 3.8 mmol/L (ref 3.5–5.1)
Sodium: 138 mmol/L (ref 135–145)

## 2020-04-28 LAB — CBC WITH DIFFERENTIAL/PLATELET
Abs Immature Granulocytes: 0.05 10*3/uL (ref 0.00–0.07)
Basophils Absolute: 0.1 10*3/uL (ref 0.0–0.1)
Basophils Relative: 0 %
Eosinophils Absolute: 0 10*3/uL (ref 0.0–0.5)
Eosinophils Relative: 0 %
HCT: 39.8 % (ref 39.0–52.0)
Hemoglobin: 13.4 g/dL (ref 13.0–17.0)
Immature Granulocytes: 0 %
Lymphocytes Relative: 27 %
Lymphs Abs: 3.3 10*3/uL (ref 0.7–4.0)
MCH: 30.9 pg (ref 26.0–34.0)
MCHC: 33.7 g/dL (ref 30.0–36.0)
MCV: 91.9 fL (ref 80.0–100.0)
Monocytes Absolute: 1 10*3/uL (ref 0.1–1.0)
Monocytes Relative: 8 %
Neutro Abs: 7.7 10*3/uL (ref 1.7–7.7)
Neutrophils Relative %: 65 %
Platelets: 157 10*3/uL (ref 150–400)
RBC: 4.33 MIL/uL (ref 4.22–5.81)
RDW: 12.1 % (ref 11.5–15.5)
WBC: 12.1 10*3/uL — ABNORMAL HIGH (ref 4.0–10.5)
nRBC: 0 % (ref 0.0–0.2)

## 2020-04-28 MED ORDER — IOHEXOL 300 MG/ML  SOLN
75.0000 mL | Freq: Once | INTRAMUSCULAR | Status: AC | PRN
  Administered 2020-04-28: 75 mL via INTRAVENOUS
  Filled 2020-04-28: qty 75

## 2020-04-28 MED ORDER — SODIUM CHLORIDE 0.9 % IV BOLUS
500.0000 mL | Freq: Once | INTRAVENOUS | Status: AC
  Administered 2020-04-28: 500 mL via INTRAVENOUS

## 2020-04-28 MED ORDER — SODIUM CHLORIDE 0.9 % IV SOLN
1.0000 g | Freq: Once | INTRAVENOUS | Status: AC
  Administered 2020-04-28: 1 g via INTRAVENOUS
  Filled 2020-04-28: qty 10

## 2020-04-28 MED ORDER — OXYCODONE HCL 5 MG PO TABS
5.0000 mg | ORAL_TABLET | Freq: Once | ORAL | Status: AC
  Administered 2020-04-28: 5 mg via ORAL
  Filled 2020-04-28: qty 1

## 2020-04-28 MED ORDER — IBUPROFEN 600 MG PO TABS
600.0000 mg | ORAL_TABLET | Freq: Four times a day (QID) | ORAL | 0 refills | Status: AC | PRN
Start: 2020-04-28 — End: ?

## 2020-04-28 MED ORDER — AMOXICILLIN 500 MG PO CAPS: 500.0000 mg | ORAL_CAPSULE | Freq: Three times a day (TID) | ORAL | 0 refills | Status: AC

## 2020-04-28 MED ORDER — LIDOCAINE VISCOUS HCL 2 % MT SOLN: 10.0000 mL | OROMUCOSAL | 0 refills | Status: AC | PRN

## 2020-04-28 NOTE — ED Provider Notes (Signed)
[facility] Emergency Department Provider Note  ____________________________________________  Time seen: Approximately 4:15 PM  I have reviewed the triage vital signs and the nursing notes.   HISTORY  Chief Complaint Abscess    HPI Danny Maxson. is a 49 y.o. male that presents to the emergency department for evaluation of right cheek swelling and dental pain for 2 days.  Patient states that he has a broken tooth and has had pain on and off to this tooth for a while.  Cheek started swelling this morning.  No fevers.  He has pain with eating but not having any difficulty swallowing or breathing.  He does not have a Pharmacist, community.   Past Medical History:  Diagnosis Date  . Hyperlipemia   . Seizures [facility])     Patient Active Problem List   Diagnosis Date Noted  . Vitamin B12 deficiency 09/21/2016  . Tobacco use disorder 09/11/2016  . Seizure disorder ([place]) 09/11/2016  . Severe recurrent major depression without psychotic features ([place]) 09/10/2016  . Suicidal ideation 09/10/2016  . Alcohol use disorder, moderate, dependence ([place]) 09/10/2016  . Cocaine use disorder, moderate, dependence ([place]) 09/10/2016    Past Surgical History:  Procedure Laterality Date  . APPENDECTOMY      Prior to Admission medications   Medication Sig Start Date End Date Taking? Authorizing Provider  amoxicillin (AMOXIL) 500 MG capsule Take 1 capsule (500 mg total) by mouth 3 (three) times daily. 04/28/20   Laban Emperor, PA-C  ARIPiprazole (ABILIFY) 10 MG tablet Take 1 tablet (10 mg total) by mouth at bedtime. 09/21/16   Pucilowska, Herma Ard B, MD  divalproex (DEPAKOTE) 500 MG DR tablet Take 1 tablet (500 mg total) by mouth every 12 (twelve) hours. 09/21/16   Pucilowska, Herma Ard B, MD  ibuprofen (ADVIL) 600 MG tablet Take 1 tablet (600 mg total) by mouth every 6 (six) hours as needed. 04/28/20   Laban Emperor, PA-C  lidocaine (LIDODERM) 5 % Place 1 patch onto the skin daily.  Remove & Discard patch within 12 hours or as directed by MD 09/18/18   Laban Emperor, PA-C  lidocaine (XYLOCAINE) 2 % solution Use as directed 10 mLs in the mouth or throat as needed. 04/28/20   Laban Emperor, PA-C  predniSONE (DELTASONE) 10 MG tablet Take 6 tablets on day 1, take 5 tablets on day 2, take 4 tablets on day 3, take 3 tablets on day 4, take 2 tablets on day 5, take 1 tablet on day 6 09/18/18   Laban Emperor, PA-C  traZODone (DESYREL) 100 MG tablet Take 1 tablet (100 mg total) by mouth at bedtime. 09/21/16   Pucilowska, Herma Ard B, MD  venlafaxine XR (EFFEXOR-XR) 150 MG 24 hr capsule Take 1 capsule (150 mg total) by mouth daily with breakfast. 09/22/16   Pucilowska, Jolanta B, MD  vitamin B-12 1000 MCG tablet Take 1 tablet (1,000 mcg total) by mouth daily. 09/21/16   Pucilowska, Wardell Honour, MD    Allergies Acetaminophen and Aspirin  History reviewed. No pertinent family history.  Social History Social History   Tobacco Use  . Smoking status: Heavy Tobacco Smoker    Packs/day: 2.00    Types: Cigarettes  . Smokeless tobacco: Never Used  Substance Use Topics  . Alcohol use: Yes    Comment: consumed 2 pints of "white liquor" this evening  . Drug use: Yes    Types: "Crack" cocaine, Marijuana     Review of Systems  Constitutional: No fever/chills Cardiovascular: No chest pain.  Respiratory: No SOB. Gastrointestinal: No abdominal pain.  No nausea, no vomiting.  Musculoskeletal: Negative for musculoskeletal pain. Skin: Negative for rash, abrasions, lacerations, ecchymosis. Neurological: Negative for headaches   ____________________________________________   PHYSICAL EXAM:  VITAL SIGNS: ED Triage Vitals [04/28/20 1427]  Enc Vitals Group     BP 119/90     Pulse Rate 84     Resp 18     Temp 98.5 F (36.9 C)     Temp Source Oral     SpO2 99 %     Weight 230 lb (104.3 kg)     Height 6\' 3"  (1.905 m)     Head Circumference      Peak Flow      Pain Score 9      Pain Loc      Pain Edu?      Excl. in GC?      Constitutional: Alert and oriented. Well appearing and in no acute distress. Eyes: Conjunctivae are normal. PERRL. EOMI. Head: Moderate swelling to right cheek/jaw. ENT:      Ears:      Nose: No congestion/rhinnorhea.      Mouth/Throat: Mucous membranes are moist.  Cavity to bottom right premolar with surrounding tenderness to palpation. Neck: No stridor.   Cardiovascular: Normal rate, regular rhythm.  Good peripheral circulation. Respiratory: Normal respiratory effort without tachypnea or retractions. Lungs CTAB. Good air entry to the bases with no decreased or absent breath sounds. Musculoskeletal: Full range of motion to all extremities. No gross deformities appreciated. Neurologic:  Normal speech and language. No gross focal neurologic deficits are appreciated.  Skin:  Skin is warm, dry and intact. No rash noted. Psychiatric: Mood and affect are normal. Speech and behavior are normal. Patient exhibits appropriate insight and judgement.   ____________________________________________   LABS (all labs ordered are listed, but only abnormal results are displayed)  Labs Reviewed  CBC WITH DIFFERENTIAL/PLATELET - Abnormal; Notable for the following components:      Result Value   WBC 12.1 (*)    All other components within normal limits  BASIC METABOLIC PANEL   ____________________________________________  EKG   ____________________________________________  RADIOLOGY   CT Maxillofacial W Contrast  Result Date: 04/28/2020 CLINICAL DATA:  Right cheek swelling EXAM: CT MAXILLOFACIAL WITH CONTRAST TECHNIQUE: Multidetector CT imaging of the maxillofacial structures was performed with intravenous contrast. Multiplanar CT image reconstructions were also generated. CONTRAST:  36mL OMNIPAQUE IOHEXOL 300 MG/ML  SOLN COMPARISON:  None. FINDINGS: Osseous: Degenerative changes of the upper cervical spine. Mild degenerative changes at the  temporomandibular joints. Orbits: Unremarkable. Sinuses: No significant opacification. Soft tissues: There is soft tissue swelling with fat infiltration and fascial thickening along the right lower face extending to the buccal surface of the mandible there is no discrete abscess. Multifocal tooth decay is present with dental caries and periapical lucency including several right mandibular teeth. In particular, there is loss of buccal cortex adjacent to the anterior right mandibular molar. Limited intracranial: No abnormal enhancement. IMPRESSION: Inflammatory changes along the mandible on the right without evidence of abscess. Likely odontogenic with loss of buccal cortex adjacent to the decayed anterior right mandibular molar. Electronically Signed   By: 70m M.D.   On: 04/28/2020 17:37    ____________________________________________    PROCEDURES  Procedure(s) performed:    Procedures    Medications  sodium chloride 0.9 % bolus 500 mL (0 mLs Intravenous Stopped 04/28/20 1600)  cefTRIAXone (ROCEPHIN) 1 g in sodium chloride 0.9 %  100 mL IVPB (0 g Intravenous Stopped 04/28/20 1803)  oxyCODONE (Oxy IR/ROXICODONE) immediate release tablet 5 mg (5 mg Oral Given 04/28/20 1602)  iohexol (OMNIPAQUE) 300 MG/ML solution 75 mL (75 mLs Intravenous Contrast Given 04/28/20 1649)     ____________________________________________   INITIAL IMPRESSION / ASSESSMENT AND PLAN / ED COURSE  Pertinent labs & imaging results that were available during my care of the patient were reviewed by me and considered in my medical decision making (see chart for details).  Review of the [place] CSRS was performed in accordance of the [facility] prior to dispensing any controlled drugs.   Patient's diagnosis is consistent with dental infection.  Patient was given a dose of I the Rocephin in the emergency department for infection.  CT scan shows inflammatory changes without abscess.  Patient will be discharged home with  prescriptions for amoxicillin. Patient is to follow up with dentist as directed. Resources were given. Patient is given ED precautions to return to the ED for any worsening or new symptoms.  Danny Johnston. was evaluated in Emergency Department on 04/28/2020 for the symptoms described in the history of present illness. He was evaluated in the context of the global COVID-19 pandemic, which necessitated consideration that the patient might be at risk for infection with the SARS-CoV-2 virus that causes COVID-19. Institutional protocols and algorithms that pertain to the evaluation of patients at risk for COVID-19 are in a state of rapid change based on information released by regulatory bodies including the CDC and federal and state organizations. These policies and algorithms were followed during the patient's care in the ED.   ____________________________________________  FINAL CLINICAL IMPRESSION(S) / ED DIAGNOSES  Final diagnoses:  Dental infection      NEW MEDICATIONS STARTED DURING THIS VISIT:  ED Discharge Orders         Ordered    amoxicillin (AMOXIL) 500 MG capsule  3 times daily     Discontinue  Reprint     04/28/20 1751    lidocaine (XYLOCAINE) 2 % solution  As needed     Discontinue  Reprint     04/28/20 1751    ibuprofen (ADVIL) 600 MG tablet  Every 6 hours PRN     Discontinue  Reprint     04/28/20 1751              This chart was dictated using voice recognition software/Dragon. Despite best efforts to proofread, errors can occur which can change the meaning. Any change was purely unintentional.    Enid Derry, PA-C 04/28/20 1815    Jene Every, MD 04/29/20 1215

## 2020-04-28 NOTE — ED Notes (Signed)
Pt presents to the ED on the L lower jaw, pt states he noticed last night and it grew over night. Pt states it is extremely painful. Pt states he isn't able to eat or drink but able to swallow and breathe without difficulty. Pt is A&Ox4 and NAD.

## 2020-04-28 NOTE — Discharge Instructions (Signed)
OPTIONS FOR DENTAL FOLLOW UP CARE ° °[place] Department [facility] and Human Services - Local Safety Net Dental Clinics °http://www.ncdhhs.gov/dph/oralhealth/services/safetynetclinics.htm °  °[facility] (336-562-3123) ° °[facility] [place] ([phone]) ° °[facility] ([phone] ext [phone]) ° °[facility] ([phone]) ° °[facility] ([phone]) °This clinic caters to the indigent population and is on a lottery system. °Location: °[facility], [address], [place] °Clinic Hours: °Wednesdays from 6pm - 9pm, patients seen by a lottery system. °For dates, call or go to [email or web address].[facility].edu/[facility]/patients/Dental-[facility] °Services: °Cleanings, fillings and simple extractions. °Payment Options: °DENTAL WORK IS FREE OF CHARGE. Bring proof of income or support. °Best way to get seen: °Arrive at 5:15 pm - this is a lottery, NOT first come/first serve, so arriving earlier will not increase your chances of being seen. °  °  °[facility] °[phone] °Select option 1 for emergencies °  °Location: °[facility], [address], [place] °Clinic Hours: °No walk-ins accepted - call the day before to schedule an appointment. °Check in times are 9:30 am and 1:30 pm. °Services: °Simple extractions, temporary fillings, pulpectomy/pulp debridement, uncomplicated abscess drainage. °Payment Options: °PAYMENT IS DUE AT THE TIME OF SERVICE.  Fee is usually $100-200, additional surgical procedures (e.g. abscess drainage) may be extra. °Cash, checks, Visa/MasterCard accepted.  Can file Medicaid if patient is covered for dental - patient should call case worker to check. °No discount for [facility] patients. °Best way to get seen: °MUST call the day before and get onto the schedule. Can usually be seen the next 1-2 days. No walk-ins accepted. °  °  °[facility] °[phone] °   °Location: °[facility], [address], [place] °Clinic Hours: °M, W, Th, F 8am or 1:30pm, Tues 9a or 1:30 - first come/first served. °Services: °Simple extractions, temporary fillings, uncomplicated abscess drainage.  You do not need to be an [place] resident. °Payment Options: °PAYMENT IS DUE AT THE TIME OF SERVICE. °Dental insurance, otherwise sliding scale - bring proof of income or support. °Depending on income and treatment needed, cost is usually $50-200. °Best way to get seen: °Arrive early as it is first come/first served. °  °  °[facility] °[phone] °  °Location: °[address] °Clinic Hours: °Mon-Thu 8a-5p °Services: °Most basic dental services including extractions and fillings. °Payment Options: °PAYMENT IS DUE AT THE TIME OF SERVICE. °Sliding scale, up to 50% off - bring proof if income or support. °Medicaid with dental option accepted. °Best way to get seen: °Call to schedule an appointment, can usually be seen within 2 weeks OR they will try to see walk-ins - show up at 8a or 2p (you may have to wait). °  °  °[facility] °[phone] °[place] RESIDENTS ONLY °  °Location: °[facility], [address], [place], [place] [address] °Clinic Hours: By appointment only. °Monday - Thursday 8am-5pm, Friday 8am-12pm °Services: Cleanings, fillings, extractions. °Payment Options: °PAYMENT IS DUE AT THE TIME OF SERVICE. °Cash, Visa or MasterCard. Sliding scale - $30 minimum per service. °Best way to get seen: °Come in to office, complete packet and make an appointment - need proof of income °or support monies for each household member and proof of [place] residence. °Usually takes about a month to get in. °  °  °[facility] °[phone] °  °Location: °[address]   [place] °Clinic Hours: Walk-in Urgent Care Dental Services are offered Monday-Friday  mornings only. °The numbers of emergencies accepted daily is limited to the number of °providers available. °Maximum 15 - Mondays, Wednesdays & Thursdays °Maximum 10 - Tuesdays & Fridays °Services: °You do not need to be a [place] resident to be seen for a dental emergency. °Emergencies are defined as pain, swelling, abnormal bleeding, or dental trauma. Walkins will receive x-rays if needed. °NOTE: Dental cleaning is not an emergency. °Payment Options: °PAYMENT IS DUE AT THE TIME OF SERVICE. °Minimum co-pay is $40.00 for uninsured patients. °Minimum co-pay is $3.00 for Medicaid with dental coverage. °Dental Insurance is accepted and must be presented at time of visit. °Medicare does not cover dental. °Forms of payment: Cash, credit card, checks. °Best way to get seen: °If not previously registered with the clinic, walk-in dental registration begins at 7:15 am and is on a first come/first serve basis. °If previously registered with the clinic, call to make an appointment. °  °  °[facility] °[phone] °[place] RESIDENTS ONLY °  °Location: °[address], [place], [place] °Clinic Hours: °Mon-Thu 10a-2p °Services: Extractions only! °Payment Options: °FREE (donations accepted) - bring proof of income or support °Best way to get seen: °Call and schedule an appointment OR come at 8am on the 1st Monday of every month (except for holidays) when it is first come/first served. °  °  °[facility] °[phone] °  °Location: °[address], [place] °Clinic Hours: °Friday mornings °Services, Payment Options, Best way to get seen: °Call for info °

## 2020-04-28 NOTE — ED Triage Notes (Signed)
Pt comes POV with an abscess on his right lower jaw. Pt states the swelling started today. Significant swelling to his right cheek. Able to open mouth, swallow, breathe.

## 2020-10-25 IMAGING — CT CT MAXILLOFACIAL W/ CM
3 series · 15 of 47 positions shown, 18 images · IV contrast (omnipaque)
Comparison: None.

CLINICAL DATA: Right cheek swelling

EXAM:
CT MAXILLOFACIAL WITH CONTRAST
TECHNIQUE: Multidetector CT imaging of the maxillofacial structures was
performed with intravenous contrast. Multiplanar CT image
reconstructions were also generated.
CONTRAST:  75mL OMNIPAQUE IOHEXOL 300 MG/ML  SOLN

[Series 2: max soft · axial · 0.39mm/px · z∈[-208,-56]mm · 9 of 90 slices shown, 12 images]
[im 7/90  brain]
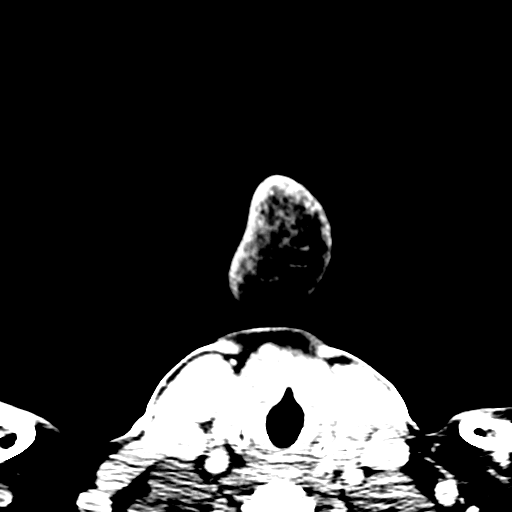
[im 7/90  bone]
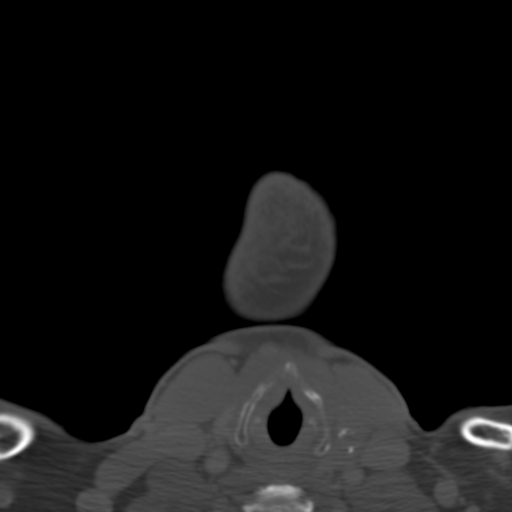
[im 16/90  bone]
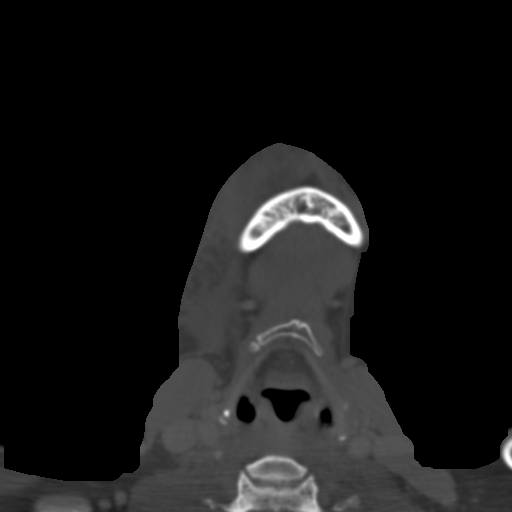
[im 25/90  bone]
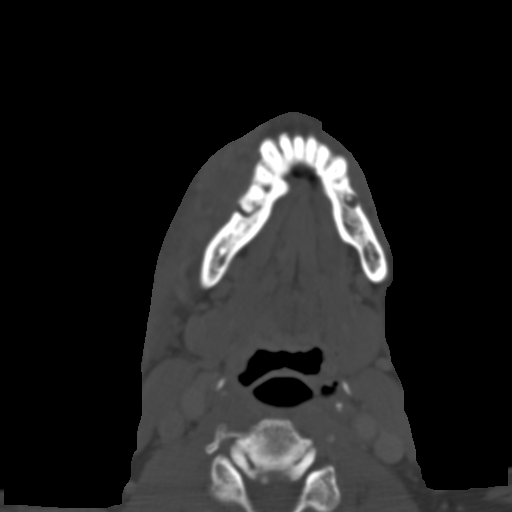
[im 34/90  bone]
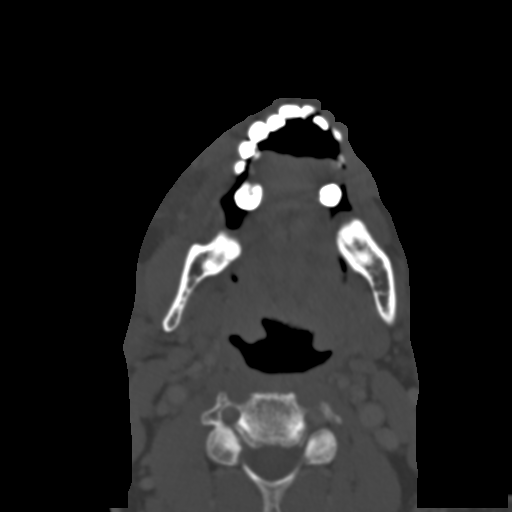
[im 47/90  brain]
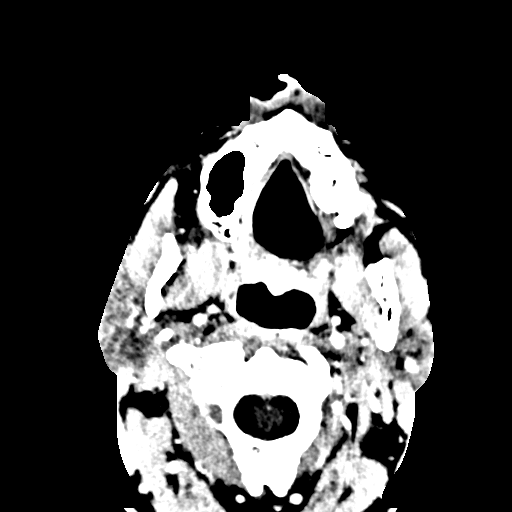
[im 47/90  bone]
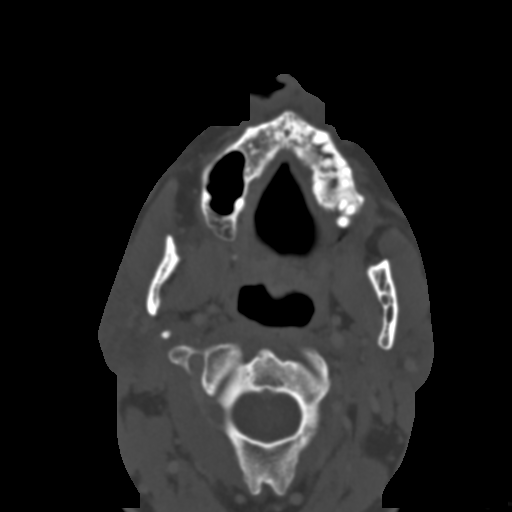
[im 56/90  bone]
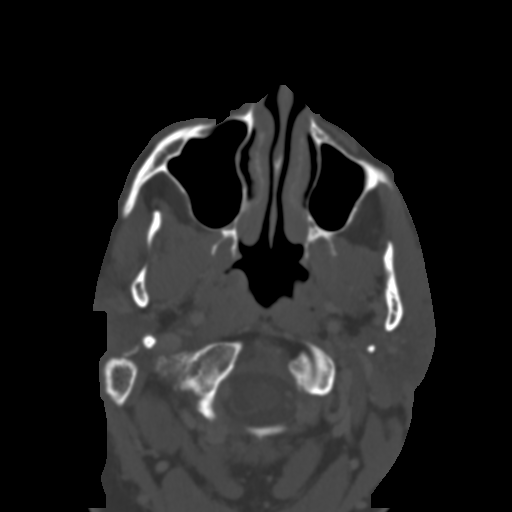
[im 65/90  bone]
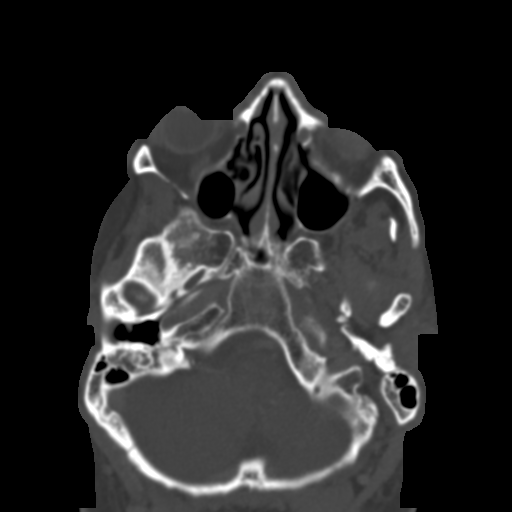
[im 74/90  bone]
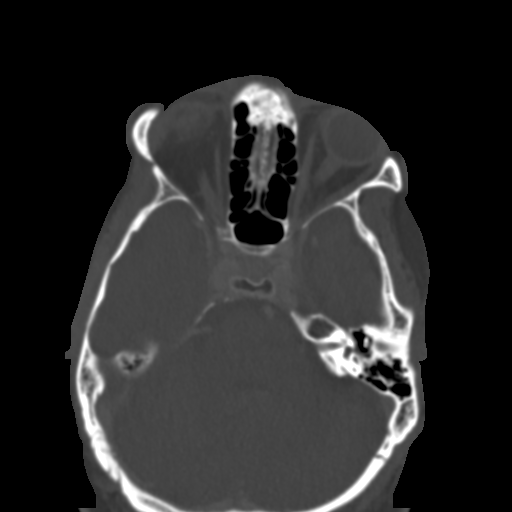
[im 83/90  brain]
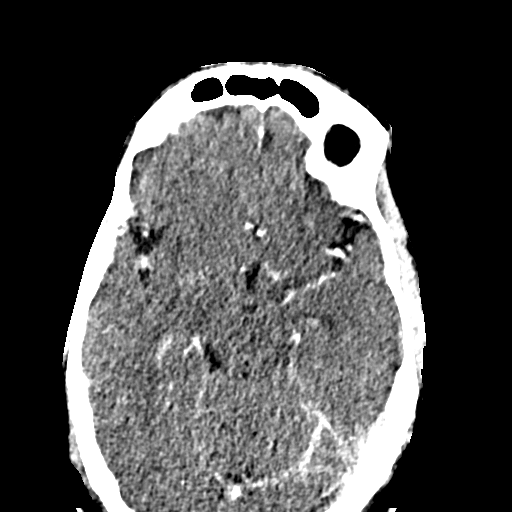
[im 83/90  bone]
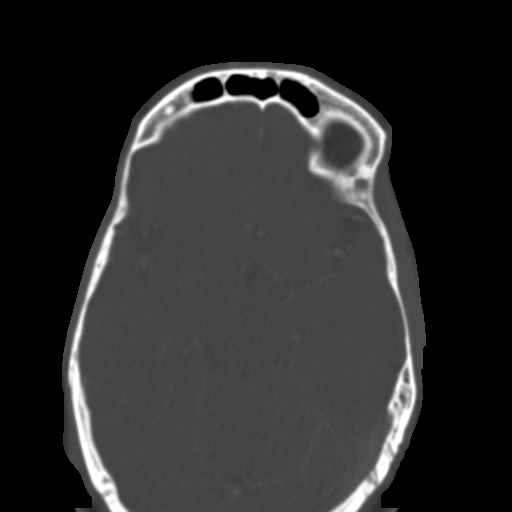

[Series 4: coronal soft · coronal · 0.32mm/px · 3 of 79 slices shown]
[im 27/79  bone]
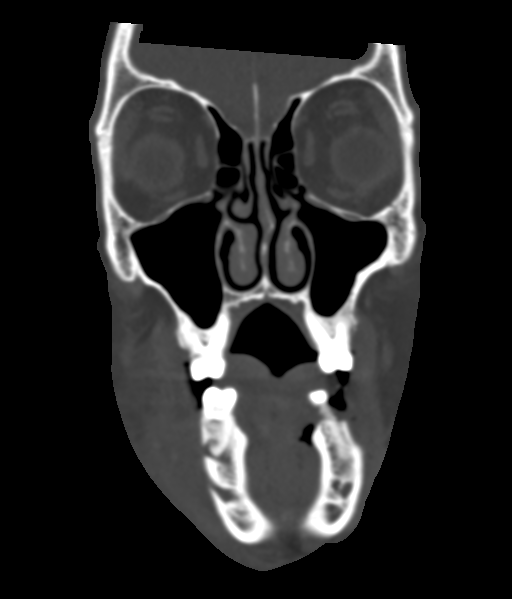
[im 35/79  bone]
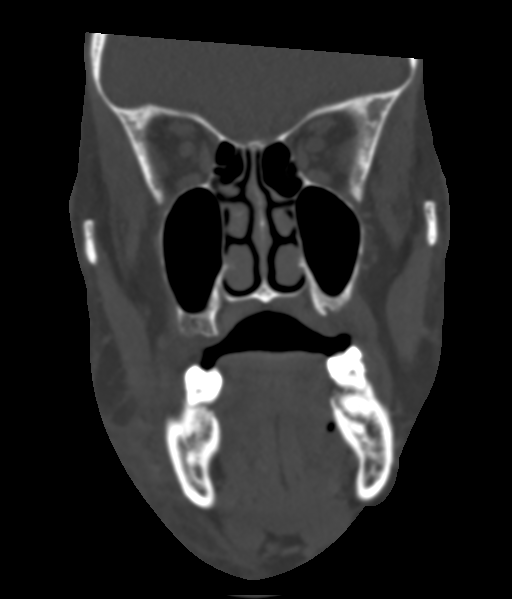
[im 44/79  bone]
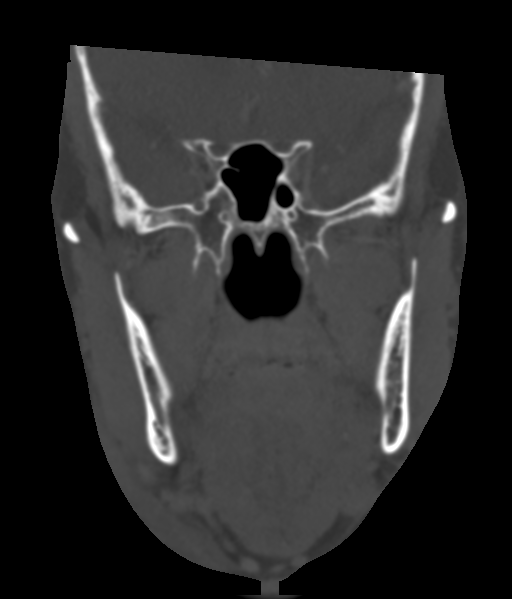

[Series 6: sagittal soft · sagittal · 0.30mm/px · 3 of 83 slices shown]
[im 28/83  bone]
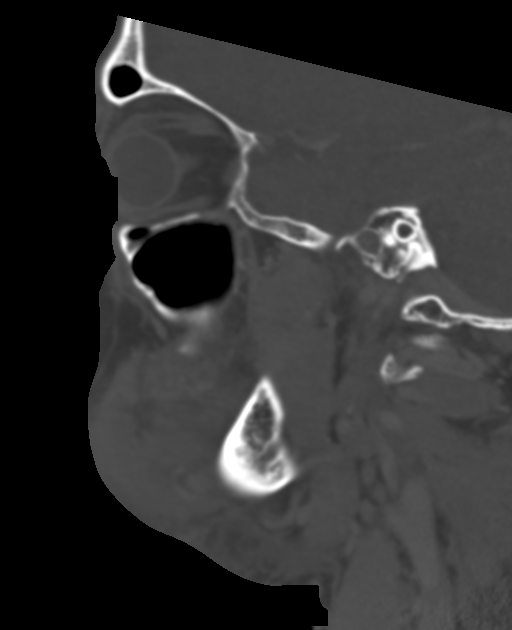
[im 42/83  bone]
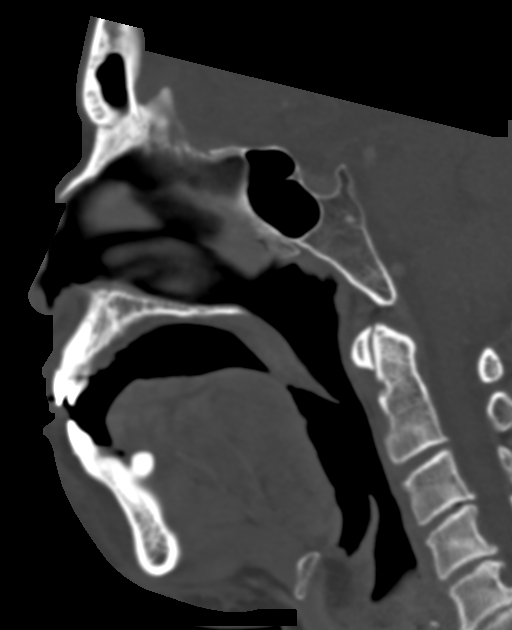
[im 55/83  bone]
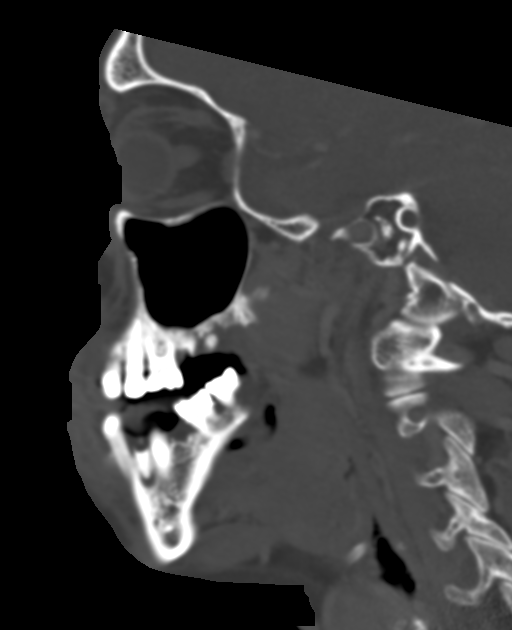

[15 of 47 positions shown; findings below may reference images not displayed]

FINDINGS: Osseous: Degenerative changes of the upper cervical spine. Mild
degenerative changes at the temporomandibular joints.

Orbits: Unremarkable.

Sinuses: No significant opacification.

Soft tissues: There is soft tissue swelling with fat infiltration
and fascial thickening along the right lower face extending to the
buccal surface of the mandible there is no discrete abscess.
Multifocal tooth decay is present with dental caries and periapical
lucency including several right mandibular teeth. In particular,
there is loss of buccal cortex adjacent to the anterior right
mandibular molar.

Limited intracranial: No abnormal enhancement.
IMPRESSION: Inflammatory changes along the mandible on the right without
evidence of abscess. Likely odontogenic with loss of buccal cortex
adjacent to the decayed anterior right mandibular molar.

## 2020-12-26 ENCOUNTER — Emergency Department
Admission: EM | Admit: 2020-12-26 | Discharge: 2020-12-27 | Disposition: A | Discharge status: Home / Self Care | Payer: Medicaid Other | Attending: Emergency Medicine | Admitting: Emergency Medicine

## 2020-12-26 DIAGNOSIS — Z79899 Other long term (current) drug therapy: Secondary | ICD-10-CM | POA: Insufficient documentation

## 2020-12-26 DIAGNOSIS — G40909 Epilepsy, unspecified, not intractable, without status epilepticus: Secondary | ICD-10-CM | POA: Insufficient documentation

## 2020-12-26 DIAGNOSIS — F17210 Nicotine dependence, cigarettes, uncomplicated: Secondary | ICD-10-CM | POA: Diagnosis not present

## 2020-12-26 DIAGNOSIS — R569 Unspecified convulsions: Secondary | ICD-10-CM | POA: Diagnosis present

## 2020-12-26 LAB — BASIC METABOLIC PANEL
Anion gap: 10 (ref 5–15)
BUN: 13 mg/dL (ref 6–20)
CO2: 23 mmol/L (ref 22–32)
Calcium: 9.3 mg/dL (ref 8.9–10.3)
Chloride: 107 mmol/L (ref 98–111)
Creatinine, Ser: 1.09 mg/dL (ref 0.61–1.24)
GFR, Estimated: >60 mL/min (ref >60)
Glucose, Bld: 87 mg/dL (ref 70–99)
Potassium: 3.8 mmol/L (ref 3.5–5.1)
Sodium: 140 mmol/L (ref 135–145)

## 2020-12-26 LAB — CBC WITH DIFFERENTIAL/PLATELET
Abs Immature Granulocytes: 0.01 10*3/uL (ref 0.00–0.07)
Basophils Absolute: 0.1 10*3/uL (ref 0.0–0.1)
Basophils Relative: 1 %
Eosinophils Absolute: 0.1 10*3/uL (ref 0.0–0.5)
Eosinophils Relative: 1 %
HCT: 37.8 % — ABNORMAL LOW (ref 39.0–52.0)
Hemoglobin: 12.2 g/dL — ABNORMAL LOW (ref 13.0–17.0)
Immature Granulocytes: 0 %
Lymphocytes Relative: 45 %
Lymphs Abs: 4.1 10*3/uL — ABNORMAL HIGH (ref 0.7–4.0)
MCH: 30.5 pg (ref 26.0–34.0)
MCHC: 32.3 g/dL (ref 30.0–36.0)
MCV: 94.5 fL (ref 80.0–100.0)
Monocytes Absolute: 0.7 10*3/uL (ref 0.1–1.0)
Monocytes Relative: 8 %
Neutro Abs: 3.9 10*3/uL (ref 1.7–7.7)
Neutrophils Relative %: 45 %
Platelets: 153 10*3/uL (ref 150–400)
RBC: 4 MIL/uL — ABNORMAL LOW (ref 4.22–5.81)
RDW: 13 % (ref 11.5–15.5)
WBC: 8.8 10*3/uL (ref 4.0–10.5)
nRBC: 0 % (ref 0.0–0.2)

## 2020-12-26 LAB — ETHANOL: Alcohol, Ethyl (B): <10 mg/dL (ref <10)

## 2020-12-26 MED ORDER — SODIUM CHLORIDE 0.9 % IV BOLUS
1000.0000 mL | Freq: Once | INTRAVENOUS | Status: AC
  Administered 2020-12-26: 1000 mL via INTRAVENOUS

## 2020-12-26 NOTE — ED Notes (Signed)
Patient provided with urinal and informed of need for urine specimen.

## 2020-12-26 NOTE — ED Notes (Signed)
Seizure pads placed on stretcher

## 2020-12-26 NOTE — ED Notes (Signed)
Pt laying in bed. Wife at bedside. Pt is slow to answer questions and speech is soft. Pt in no visual distress. Pt on cardiac, bp and pulse ox monitor.

## 2020-12-26 NOTE — ED Provider Notes (Signed)
[facility] Emergency Department Provider Note   ____________________________________________   I have reviewed the triage vital signs and the nursing notes.   HISTORY  Chief Complaint Seizures   History limited by and level 5 cavea due to: Post ictal state   HPI Danny Johnston. is a 50 y.o. male who presents to the emergency department today after having an apparent seizure.  Per report the patient had shaking and then was confused afterwards.  The time my exam he is somewhat more awake can answer some questions primarily through nodding yes or no.  Patient does have a history of seizures.  He does acknowledge that he has not been taking his seizure medications like normal.  He denies any headache.   Records reviewed. Per medical record review patient has a history of seizures. HLD.   Past Medical History:  Diagnosis Date  . Hyperlipemia   . Seizures [facility])     Patient Active Problem List   Diagnosis Date Noted  . Vitamin B12 deficiency 09/21/2016  . Tobacco use disorder 09/11/2016  . Seizure disorder (HCC) 09/11/2016  . Severe recurrent major depression without psychotic features (HCC) 09/10/2016  . Suicidal ideation 09/10/2016  . Alcohol use disorder, moderate, dependence (HCC) 09/10/2016  . Cocaine use disorder, moderate, dependence (HCC) 09/10/2016    Past Surgical History:  Procedure Laterality Date  . APPENDECTOMY      Prior to Admission medications   Medication Sig Start Date End Date Taking? Authorizing Provider  amoxicillin (AMOXIL) 500 MG capsule Take 1 capsule (500 mg total) by mouth 3 (three) times daily. 04/28/20   Enid Derry, PA-C  ARIPiprazole (ABILIFY) 10 MG tablet Take 1 tablet (10 mg total) by mouth at bedtime. 09/21/16   Pucilowska, Braulio Conte B, MD  divalproex (DEPAKOTE) 500 MG DR tablet Take 1 tablet (500 mg total) by mouth every 12 (twelve) hours. 09/21/16   Pucilowska, Braulio Conte B, MD  ibuprofen (ADVIL) 600 MG tablet Take  1 tablet (600 mg total) by mouth every 6 (six) hours as needed. 04/28/20   Enid Derry, PA-C  lidocaine (LIDODERM) 5 % Place 1 patch onto the skin daily. Remove & Discard patch within 12 hours or as directed by MD 09/18/18   Enid Derry, PA-C  lidocaine (XYLOCAINE) 2 % solution Use as directed 10 mLs in the mouth or throat as needed. 04/28/20   Enid Derry, PA-C  predniSONE (DELTASONE) 10 MG tablet Take 6 tablets on day 1, take 5 tablets on day 2, take 4 tablets on day 3, take 3 tablets on day 4, take 2 tablets on day 5, take 1 tablet on day 6 09/18/18   Enid Derry, PA-C  traZODone (DESYREL) 100 MG tablet Take 1 tablet (100 mg total) by mouth at bedtime. 09/21/16   Pucilowska, Braulio Conte B, MD  venlafaxine XR (EFFEXOR-XR) 150 MG 24 hr capsule Take 1 capsule (150 mg total) by mouth daily with breakfast. 09/22/16   Pucilowska, Jolanta B, MD  vitamin B-12 1000 MCG tablet Take 1 tablet (1,000 mcg total) by mouth daily. 09/21/16   Pucilowska, Ellin Goodie, MD    Allergies Acetaminophen and Aspirin  History reviewed. No pertinent family history.  Social History Social History   Tobacco Use  . Smoking status: Heavy Tobacco Smoker    Packs/day: 2.00    Types: Cigarettes  . Smokeless tobacco: Never Used  Substance Use Topics  . Alcohol use: Yes    Comment: consumed 2 pints of "white liquor" this evening  .  Drug use: Yes    Types: "Crack" cocaine, Marijuana    Review of Systems Unable to obtain ROS secondary to post ictal state.  ____________________________________________   PHYSICAL EXAM:  VITAL SIGNS: ED Triage Vitals  Enc Vitals Group     BP 12/26/20 2109 107/73     Pulse Rate 12/26/20 2109 (!) 58     Resp 12/26/20 2109 20     Temp 12/26/20 2109 97.9 F (36.6 C)     Temp src --      SpO2 12/26/20 2109 97 %     Weight 12/26/20 2112 190 lb (86.2 kg)     Height 12/26/20 2112 6\' 3"  (1.905 m)    Constitutional: Awake and alert. Appears post ictal.  Eyes: Conjunctivae are  normal.  ENT      Head: Normocephalic and atraumatic.      Nose: No congestion/rhinnorhea.      Mouth/Throat: Mucous membranes are moist.      Neck: No stridor. Hematological/Lymphatic/Immunilogical: No cervical lymphadenopathy. Cardiovascular: Normal rate, regular rhythm.  No murmurs, rubs, or gallops.  Respiratory: Normal respiratory effort without tachypnea nor retractions. Breath sounds are clear and equal bilaterally. No wheezes/rales/rhonchi. Gastrointestinal: Soft and non tender. No rebound. No guarding.  Genitourinary: Deferred Musculoskeletal: Normal range of motion in all extremities. No lower extremity edema. Neurologic:  Awake, alert. Moving all extremities.  Skin:  Skin is warm, dry and intact. No rash noted. Psychiatric: Mood and affect are normal. Speech and behavior are normal. Patient exhibits appropriate insight and judgment.  ____________________________________________    LABS (pertinent positives/negatives)  BMP wnl CBC wbc 8.8, hgb 12.2, plt 153  ____________________________________________   EKG  I, , attending physician, personally viewed and interpreted this EKG  EKG Time: 2107 Rate: 57 Rhythm: sinus bradycardia Axis: normal Intervals: qtc 400 QRS: narrow ST changes: no st elevation Impression: abnormal ekg   ____________________________________________    RADIOLOGY  None  ____________________________________________   PROCEDURES  Procedures  ____________________________________________   INITIAL IMPRESSION / ASSESSMENT AND PLAN / ED COURSE  Pertinent labs & imaging results that were available during my care of the patient were reviewed by me and considered in my medical decision making (see chart for details).   Patient presented to the emergency department after an apparent seizure.  Patient does have history of seizures.  Both parent and then his wife when she arrived to the emergency department stated that he  has not been taking his medication.  I do think likely this is the cause of the patient's seizure.  I did order a valproic acid and will wait on those results before ordering further valproic acid to ensure that he does not have any in his system.  Do think patient will likely be able to be discharged home upon returning to baseline and after receiving medication if needed.  ____________________________________________   FINAL CLINICAL IMPRESSION(S) / ED DIAGNOSES  Final diagnoses:  Seizure [facility])     Note: This dictation was prepared with Dragon dictation. Any transcriptional errors that result from this process are unintentional     [facility], MD 12/26/20 [phone]

## 2020-12-26 NOTE — ED Notes (Signed)
Dr. Derrill Kay at bedside, patient answering questions slowly, remains sleepy.

## 2020-12-26 NOTE — ED Triage Notes (Addendum)
Patient BIBA from home. Per friend/family patient sat down on couch then had bilateral arm shaking for several seconds. Patient has been lethargic since that time. Patient has hx of seizures. Per EMS and the medications at home, patient has not taken any of his medications since 12/23/20. Patient vitals WNL.

## 2020-12-27 LAB — VALPROIC ACID LEVEL: Valproic Acid Lvl: <10 ug/mL — ABNORMAL LOW (ref 50.0–100.0)

## 2020-12-27 MED ORDER — VALPROATE SODIUM 100 MG/ML IV SOLN
500.0000 mg | Freq: Once | INTRAVENOUS | Status: AC
  Administered 2020-12-27: 500 mg via INTRAVENOUS
  Filled 2020-12-27 (×2): qty 5

## 2020-12-27 MED ORDER — VALPROATE SODIUM 100 MG/ML IV SOLN
500.0000 mg | Freq: Once | INTRAVENOUS | Status: DC
  Filled 2020-12-27: qty 5

## 2020-12-27 NOTE — ED Notes (Signed)
Darcella Gasman here to pick pt up, states that the pt is supposed to be monitored for 24 hours in the hospital after having a seizure, upset that pt is being discharged, family informed that the pt and significant other stated that he doesn't take his seizure medications at home regularly, significant other states that she reminds him to do so but he doesn't, pt states that he will take his meds when he gets home

## 2020-12-27 NOTE — Discharge Instructions (Addendum)
Please make sure you are taking your valproic acid at home.

## 2020-12-27 NOTE — ED Notes (Signed)
Crackers that RN gave pt are noted to be gone.

## 2020-12-27 NOTE — ED Notes (Signed)
Pt noted to still be drowsy. RN set [facility] to high fowlers and provided pt with orange juice and crackers and encouraging him to try and eat and drink.

## 2020-12-27 NOTE — ED Notes (Signed)
RN called pharmacy. Pharmacy stated they sent the [facility]. This RN and pharmacy tech looked through department/med station. RN unable to find it. RN called pharmacy and stated they will send it up again. ER provider notified.

## 2021-04-12 ENCOUNTER — Emergency Department
Admission: EM | Admit: 2021-04-12 | Discharge: 2021-04-12 | Disposition: A | Discharge status: Home / Self Care | Payer: Medicaid Other | Attending: Emergency Medicine | Admitting: Emergency Medicine

## 2021-04-12 ENCOUNTER — Encounter: Payer: Self-pay | Admitting: Emergency Medicine

## 2021-04-12 ENCOUNTER — Other Ambulatory Visit: Payer: Self-pay

## 2021-04-12 DIAGNOSIS — Z79899 Other long term (current) drug therapy: Secondary | ICD-10-CM

## 2021-04-12 DIAGNOSIS — R200 Anesthesia of skin: Secondary | ICD-10-CM | POA: Diagnosis present

## 2021-04-12 DIAGNOSIS — F17210 Nicotine dependence, cigarettes, uncomplicated: Secondary | ICD-10-CM | POA: Diagnosis not present

## 2021-04-12 DIAGNOSIS — R55 Syncope and collapse: Secondary | ICD-10-CM | POA: Insufficient documentation

## 2021-04-12 DIAGNOSIS — Z9114 Patient's other noncompliance with medication regimen: Secondary | ICD-10-CM | POA: Insufficient documentation

## 2021-04-12 DIAGNOSIS — E7143 Iatrogenic carnitine deficiency: Secondary | ICD-10-CM | POA: Insufficient documentation

## 2021-04-12 LAB — CBC WITH DIFFERENTIAL/PLATELET
Abs Immature Granulocytes: 0.01 10*3/uL (ref 0.00–0.07)
Basophils Absolute: 0 10*3/uL (ref 0.0–0.1)
Basophils Relative: 0 %
Eosinophils Absolute: 0 10*3/uL (ref 0.0–0.5)
Eosinophils Relative: 0 %
HCT: 37.6 % — ABNORMAL LOW (ref 39.0–52.0)
Hemoglobin: 12.4 g/dL — ABNORMAL LOW (ref 13.0–17.0)
Immature Granulocytes: 0 %
Lymphocytes Relative: 32 %
Lymphs Abs: 2.3 10*3/uL (ref 0.7–4.0)
MCH: 31.3 pg (ref 26.0–34.0)
MCHC: 33 g/dL (ref 30.0–36.0)
MCV: 94.9 fL (ref 80.0–100.0)
Monocytes Absolute: 0.6 10*3/uL (ref 0.1–1.0)
Monocytes Relative: 8 %
Neutro Abs: 4.4 10*3/uL (ref 1.7–7.7)
Neutrophils Relative %: 60 %
Platelets: 182 10*3/uL (ref 150–400)
RBC: 3.96 MIL/uL — ABNORMAL LOW (ref 4.22–5.81)
RDW: 13 % (ref 11.5–15.5)
WBC: 7.4 10*3/uL (ref 4.0–10.5)
nRBC: 0 % (ref 0.0–0.2)

## 2021-04-12 LAB — BASIC METABOLIC PANEL
Anion gap: 7 (ref 5–15)
BUN: 11 mg/dL (ref 6–20)
CO2: 28 mmol/L (ref 22–32)
Calcium: 9.4 mg/dL (ref 8.9–10.3)
Chloride: 106 mmol/L (ref 98–111)
Creatinine, Ser: 1.13 mg/dL (ref 0.61–1.24)
GFR, Estimated: >60 mL/min (ref >60)
Glucose, Bld: 77 mg/dL (ref 70–99)
Potassium: 3.9 mmol/L (ref 3.5–5.1)
Sodium: 141 mmol/L (ref 135–145)

## 2021-04-12 LAB — VALPROIC ACID LEVEL: Valproic Acid Lvl: 10 ug/mL — ABNORMAL LOW (ref 50.0–100.0)

## 2021-04-12 NOTE — ED Notes (Signed)
First Nurse Note: Pt to ED via [facility] from work for feeling hot and shaky. Pt vital signs WNL. Pt is in NAD.

## 2021-04-12 NOTE — ED Provider Notes (Signed)
[facility] Emergency Department Provider Note   ____________________________________________   Event Date/Time   First MD Initiated Contact with Patient 04/12/21 1638     (approximate)  I have reviewed the triage vital signs and the nursing notes.   HISTORY  Chief Complaint Seizures    HPI Danny Johnston. is a 50 y.o. male with the below stated past medical history who presents for "preseizure symptoms".  Patient states that he felt tingling in his hands and feet as well as feeling flushed and as if he was going to pass out.  Patient states that these symptoms are similar to 4 he has had a seizure in the past.  Patient is concerned as he has been out of his valproic acid medicine until this week as well as has missed 2 of his twice daily doses 2 days in a row.  Patient states that this was an oversight on his medication.  Patient denies any continued symptoms at this time and has no complaints.  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 Authorizing Provider  amoxicillin (AMOXIL) 500 MG capsule Take 1 capsule (500 mg total) by mouth 3  (three) times daily. 04/28/20   Enid Derry, PA-C  ARIPiprazole (ABILIFY) 10 MG tablet Take 1 tablet (10 mg total) by mouth at bedtime. 09/21/16   Pucilowska, Braulio Conte B, MD  divalproex (DEPAKOTE) 500 MG DR tablet Take 1 tablet (500 mg total) by mouth every 12 (twelve) hours. 09/21/16   Pucilowska, Braulio Conte B, MD  ibuprofen (ADVIL) 600 MG tablet Take 1 tablet (600 mg total) by mouth every 6 (six) hours as needed. 04/28/20   Enid Derry, PA-C  lidocaine (LIDODERM) 5 % Place 1 patch onto the skin daily. Remove & Discard patch within 12 hours or as directed by MD 09/18/18   Enid Derry, PA-C  lidocaine (XYLOCAINE) 2 % solution Use as directed 10 mLs in the mouth or throat as needed. 04/28/20   Enid Derry, PA-C  predniSONE (DELTASONE) 10 MG tablet Take 6 tablets on day 1, take 5 tablets on day 2, take 4 tablets on day 3, take 3 tablets on day 4, take 2 tablets on day 5, take 1 tablet on day 6 09/18/18   Enid Derry, PA-C  traZODone (DESYREL) 100 MG tablet Take 1 tablet (100 mg total) by mouth at bedtime. 09/21/16   Pucilowska, Braulio Conte B, MD  venlafaxine XR (EFFEXOR-XR) 150 MG 24 hr capsule Take 1 capsule (150 mg total) by mouth daily with breakfast. 09/22/16   Pucilowska, Jolanta B, MD  vitamin B-12 1000 MCG tablet Take 1 tablet (1,000 mcg total) by mouth daily. 09/21/16   [name], MD  Allergies Acetaminophen and Aspirin  No family history on file.  Social History Social History   Tobacco Use   Smoking status: Heavy Smoker    Packs/day: 2.00    Pack years: 0.00    Types: Cigarettes   Smokeless tobacco: Never  Substance Use Topics   Alcohol use: Yes    Comment: consumed 2 pints of "white liquor" this evening   Drug use: Yes    Types: "Crack" cocaine, Marijuana    Review of Systems Constitutional: No fever/chills Eyes: No visual changes. ENT: No sore throat. Cardiovascular: Denies chest pain. Respiratory: Denies shortness of breath. Gastrointestinal: No  abdominal pain.  No nausea, no vomiting.  No diarrhea. Genitourinary: Negative for dysuria. Musculoskeletal: Negative for acute arthralgias Skin: Negative for rash. Neurological: Endorses generalized weakness that has resolved.  Endorses bilateral hand paresthesias that have resolved.  Negative for headaches Psychiatric: Negative for suicidal ideation/homicidal ideation   ____________________________________________   PHYSICAL EXAM:  VITAL SIGNS: ED Triage Vitals [04/12/21 1420]  Enc Vitals Group     BP 121/76     Pulse Rate 68     Resp 16     Temp 97.7 F (36.5 C)     Temp Source Oral     SpO2 100 %     Weight 220 lb (99.8 kg)     Height 6\' 2"  (1.88 m)     Head Circumference      Peak Flow      Pain Score 0     Pain Loc      Pain Edu?      Excl. in GC?    Constitutional: Alert and oriented. Well appearing and in no acute distress. Eyes: Conjunctivae are normal. PERRL. Head: Atraumatic. Nose: No congestion/rhinnorhea. Mouth/Throat: Mucous membranes are moist. Neck: No stridor Cardiovascular: Grossly normal heart sounds.  Good peripheral circulation. Respiratory: Normal respiratory effort.  No retractions. Gastrointestinal: Soft and nontender. No distention. Musculoskeletal: No obvious deformities Neurologic:  Normal speech and language. No gross focal neurologic deficits are appreciated. Skin:  Skin is warm and dry. No rash noted. Psychiatric: Mood and affect are normal. Speech and behavior are normal.  ____________________________________________   LABS (all labs ordered are listed, but only abnormal results are displayed)  Labs Reviewed  VALPROIC ACID LEVEL - Abnormal; Notable for the following components:      Result Value   Valproic Acid Lvl 10 (*)    All other components within normal limits  CBC WITH DIFFERENTIAL/PLATELET - Abnormal; Notable for the following components:   RBC 3.96 (*)    Hemoglobin 12.4 (*)    HCT 37.6 (*)    All other components  within normal limits  BASIC METABOLIC PANEL  CBG MONITORING, ED   PROCEDURES  Procedure(s) performed (including Critical Care):  .1-3 Lead EKG Interpretation  Date/Time: 04/12/2021 5:17 PM Performed by: 06/12/2021, MD Authorized by: Merwyn Katos, MD     Interpretation: normal     ECG rate:  62   ECG rate assessment: normal     Rhythm: sinus rhythm     Ectopy: none     Conduction: normal     ____________________________________________   INITIAL IMPRESSION / ASSESSMENT AND PLAN / ED COURSE  As part of my medical decision making, I reviewed the following data within the electronic MEDICAL RECORD NUMBER Nursing notes reviewed and incorporated, Labs reviewed, EKG interpreted, Old chart reviewed, Radiograph reviewed and Notes from prior ED visits reviewed and incorporated  Patient presents after recent symptoms similar to previous seizure episodes.  No immunosuppresion hx and had no preceding fever. No history of alcohol abuse or suspicion for toxin ingestion. Unlikely stroke, syncope. Unlikely infectious etiology. No preceding trauma. Patient does state that he has been out of his valproic acid medication until this week and patient's valproic acid level is 10.  Patient encouraged to take 1 extra dose of his valproic acid today as well as follow-up within the next week for recheck of his valproic acid level with Dr. Jim Like Workup: EKG, BMP, POCT glucose (pregnancy test if male) and CT Brain.  Field Interventions: None ED Interventions: None Disposition: Discharge home with primary care follow up in next 24-48 hours.      ____________________________________________   FINAL CLINICAL IMPRESSION(S) / ED DIAGNOSES  Final diagnoses:  On valproic acid therapy  Near syncope  Nonadherence to medication     ED Discharge Orders     None        Note:  This document was prepared using Dragon voice recognition software and may include unintentional  dictation errors.    Merwyn Katos, MD 04/12/21 [phone]

## 2021-04-12 NOTE — ED Triage Notes (Signed)
Pt to ED via [facility] from work. Per EMS pt c/o being hot and shaky/ Pt reports that he is being seen for seizures. When asked if he had a seizure today pt states that he had symptoms of them. Pt reports that he was shaky and his hands were tingling and he was short of breath. Pt states that he has hx/o seizure and is on medication. Pt denies missing any of his seizure medications. Pt denies any recent changes that would increase risk for seizures. Pt is in NAD.

## 2021-04-12 NOTE — ED Notes (Signed)
D/C and to take an extra dose of valproic acid discussed with pt, pt verbalized understanding. NAD noted. VSS. Pt ambulatory with steady gait on D/C.

## 2021-04-12 NOTE — Discharge Instructions (Addendum)
Please take 1 extra dose of your valproic acid when you go home this evening as well as your regularly scheduled dose before you go to bed.  You can resume your normal twice daily dosing starting tomorrow

## 2021-05-26 ENCOUNTER — Other Ambulatory Visit: Payer: Self-pay

## 2021-05-26 ENCOUNTER — Encounter: Payer: Self-pay | Admitting: Emergency Medicine

## 2021-05-26 ENCOUNTER — Emergency Department: Payer: Medicaid Other

## 2021-05-26 ENCOUNTER — Emergency Department
Admission: EM | Admit: 2021-05-26 | Discharge: 2021-05-26 | Disposition: A | Discharge status: Home / Self Care | Payer: Medicaid Other | Attending: Emergency Medicine | Admitting: Emergency Medicine

## 2021-05-26 DIAGNOSIS — S00511A Abrasion of lip, initial encounter: Secondary | ICD-10-CM | POA: Diagnosis not present

## 2021-05-26 DIAGNOSIS — M79602 Pain in left arm: Secondary | ICD-10-CM | POA: Insufficient documentation

## 2021-05-26 DIAGNOSIS — F17210 Nicotine dependence, cigarettes, uncomplicated: Secondary | ICD-10-CM | POA: Diagnosis not present

## 2021-05-26 DIAGNOSIS — S0081XA Abrasion of other part of head, initial encounter: Secondary | ICD-10-CM | POA: Diagnosis not present

## 2021-05-26 DIAGNOSIS — Y92410 Unspecified street and highway as the place of occurrence of the external cause: Secondary | ICD-10-CM | POA: Insufficient documentation

## 2021-05-26 DIAGNOSIS — S0990XA Unspecified injury of head, initial encounter: Secondary | ICD-10-CM | POA: Diagnosis present

## 2021-05-26 MED ORDER — TIZANIDINE HCL 4 MG PO TABS: 4.0000 mg | ORAL_TABLET | Freq: Three times a day (TID) | ORAL | 0 refills | Status: AC
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
## 2021-05-26 NOTE — Discharge Instructions (Addendum)
Continue the ibuprofen. Take the muscle relaxer as prescribed.  Follow up with primary care or return to the ER for symptoms of concern.
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
## 2023-04-12 ENCOUNTER — Emergency Department: Payer: Medicaid Other

## 2023-04-12 ENCOUNTER — Observation Stay
Admission: EM | Admit: 2023-04-12 | Discharge: 2023-04-13 | Disposition: A | Discharge status: Home / Self Care | Payer: Medicaid Other | Attending: Family Medicine | Admitting: Family Medicine

## 2023-04-12 ENCOUNTER — Other Ambulatory Visit: Payer: Self-pay

## 2023-04-12 DIAGNOSIS — R0902 Hypoxemia: Secondary | ICD-10-CM | POA: Diagnosis not present

## 2023-04-12 DIAGNOSIS — F1020 Alcohol dependence, uncomplicated: Secondary | ICD-10-CM | POA: Diagnosis present

## 2023-04-12 DIAGNOSIS — F17210 Nicotine dependence, cigarettes, uncomplicated: Secondary | ICD-10-CM | POA: Insufficient documentation

## 2023-04-12 DIAGNOSIS — D72829 Elevated white blood cell count, unspecified: Secondary | ICD-10-CM | POA: Diagnosis not present

## 2023-04-12 DIAGNOSIS — R404 Transient alteration of awareness: Secondary | ICD-10-CM | POA: Diagnosis not present

## 2023-04-12 DIAGNOSIS — F1420 Cocaine dependence, uncomplicated: Secondary | ICD-10-CM | POA: Diagnosis present

## 2023-04-12 DIAGNOSIS — R569 Unspecified convulsions: Secondary | ICD-10-CM | POA: Diagnosis not present

## 2023-04-12 DIAGNOSIS — G40909 Epilepsy, unspecified, not intractable, without status epilepticus: Principal | ICD-10-CM | POA: Insufficient documentation

## 2023-04-12 DIAGNOSIS — R55 Syncope and collapse: Secondary | ICD-10-CM | POA: Diagnosis not present

## 2023-04-12 DIAGNOSIS — E1165 Type 2 diabetes mellitus with hyperglycemia: Secondary | ICD-10-CM | POA: Diagnosis not present

## 2023-04-12 DIAGNOSIS — R739 Hyperglycemia, unspecified: Secondary | ICD-10-CM | POA: Diagnosis not present

## 2023-04-12 DIAGNOSIS — Z79899 Other long term (current) drug therapy: Secondary | ICD-10-CM | POA: Insufficient documentation

## 2023-04-12 DIAGNOSIS — G40919 Epilepsy, unspecified, intractable, without status epilepticus: Secondary | ICD-10-CM | POA: Diagnosis present

## 2023-04-12 LAB — COMPREHENSIVE METABOLIC PANEL
ALT: 14 U/L (ref 0–44)
AST: 24 U/L (ref 15–41)
Albumin: 3.4 g/dL — ABNORMAL LOW (ref 3.5–5.0)
Alkaline Phosphatase: 57 U/L (ref 38–126)
Anion gap: 7 (ref 5–15)
BUN: 17 mg/dL (ref 6–20)
CO2: 26 mmol/L (ref 22–32)
Calcium: 8.3 mg/dL — ABNORMAL LOW (ref 8.9–10.3)
Chloride: 107 mmol/L (ref 98–111)
Creatinine, Ser: 1.22 mg/dL (ref 0.61–1.24)
GFR, Estimated: >60 mL/min (ref >60)
Glucose, Bld: 179 mg/dL — ABNORMAL HIGH (ref 70–99)
Potassium: 5.4 mmol/L — ABNORMAL HIGH (ref 3.5–5.1)
Sodium: 140 mmol/L (ref 135–145)
Total Bilirubin: 0.6 mg/dL (ref 0.3–1.2)
Total Protein: 6.2 g/dL — ABNORMAL LOW (ref 6.5–8.1)

## 2023-04-12 LAB — CBC WITH DIFFERENTIAL/PLATELET
Abs Immature Granulocytes: 0.07 10*3/uL (ref 0.00–0.07)
Basophils Absolute: 0.1 10*3/uL (ref 0.0–0.1)
Basophils Relative: 0 %
Eosinophils Absolute: 0 10*3/uL (ref 0.0–0.5)
Eosinophils Relative: 0 %
HCT: 47 % (ref 39.0–52.0)
Hemoglobin: 15 g/dL (ref 13.0–17.0)
Immature Granulocytes: 1 %
Lymphocytes Relative: 5 %
Lymphs Abs: 0.7 10*3/uL (ref 0.7–4.0)
MCH: 32.1 pg (ref 26.0–34.0)
MCHC: 31.9 g/dL (ref 30.0–36.0)
MCV: 100.4 fL — ABNORMAL HIGH (ref 80.0–100.0)
Monocytes Absolute: 0.5 10*3/uL (ref 0.1–1.0)
Monocytes Relative: 3 %
Neutro Abs: 14.1 10*3/uL — ABNORMAL HIGH (ref 1.7–7.7)
Neutrophils Relative %: 91 %
Platelets: 164 10*3/uL (ref 150–400)
RBC: 4.68 MIL/uL (ref 4.22–5.81)
RDW: 12.9 % (ref 11.5–15.5)
Smear Review: NORMAL
WBC: 15.5 10*3/uL — ABNORMAL HIGH (ref 4.0–10.5)
nRBC: 0 % (ref 0.0–0.2)

## 2023-04-12 LAB — URINALYSIS, ROUTINE W REFLEX MICROSCOPIC
Bilirubin Urine: NEGATIVE
Glucose, UA: NEGATIVE mg/dL
Hgb urine dipstick: NEGATIVE
Ketones, ur: NEGATIVE mg/dL
Leukocytes,Ua: NEGATIVE
Nitrite: NEGATIVE
Protein, ur: 30 mg/dL — AB
Specific Gravity, Urine: 1.025 (ref 1.005–1.030)
Squamous Epithelial / HPF: NONE SEEN /HPF (ref 0–5)
pH: 5 (ref 5.0–8.0)

## 2023-04-12 LAB — URINE DRUG SCREEN, QUALITATIVE (ARMC ONLY)
Amphetamines, Ur Screen: NOT DETECTED
Barbiturates, Ur Screen: NOT DETECTED
Benzodiazepine, Ur Scrn: POSITIVE — AB
Cannabinoid 50 Ng, Ur ~~LOC~~: NOT DETECTED
Cocaine Metabolite,Ur ~~LOC~~: POSITIVE — AB
MDMA (Ecstasy)Ur Screen: NOT DETECTED
Methadone Scn, Ur: NOT DETECTED
Opiate, Ur Screen: NOT DETECTED
Phencyclidine (PCP) Ur S: NOT DETECTED
Tricyclic, Ur Screen: NOT DETECTED

## 2023-04-12 LAB — LACTIC ACID, PLASMA: Lactic Acid, Venous: 3.1 mmol/L (ref 0.5–1.9)

## 2023-04-12 LAB — SALICYLATE LEVEL: Salicylate Lvl: <7 mg/dL — ABNORMAL LOW (ref 7.0–30.0)

## 2023-04-12 LAB — ACETAMINOPHEN LEVEL: Acetaminophen (Tylenol), Serum: <10 ug/mL — ABNORMAL LOW (ref 10–30)

## 2023-04-12 LAB — ETHANOL: Alcohol, Ethyl (B): <10 mg/dL (ref <10)

## 2023-04-12 LAB — TROPONIN I (HIGH SENSITIVITY): Troponin I (High Sensitivity): 4 ng/L (ref <18)

## 2023-04-12 MED ORDER — SODIUM CHLORIDE 0.9 % IV BOLUS
1000.0000 mL | Freq: Once | INTRAVENOUS | Status: AC
  Administered 2023-04-12: 1000 mL via INTRAVENOUS

## 2023-04-12 MED ORDER — SODIUM CHLORIDE 0.9 % IV SOLN
2000.0000 mg | Freq: Once | INTRAVENOUS | Status: AC
  Administered 2023-04-12: 2000 mg via INTRAVENOUS
  Filled 2023-04-12: qty 20

## 2023-04-12 MED ORDER — LORAZEPAM 2 MG/ML IJ SOLN
2.0000 mg | Freq: Once | INTRAMUSCULAR | Status: AC
  Administered 2023-04-12: 2 mg via INTRAVENOUS
  Filled 2023-04-12: qty 1

## 2023-04-12 NOTE — ED Provider Notes (Addendum)
[facility] Provider Note    Event Date/Time   First MD Initiated Contact with Patient 04/12/23 2204     (approximate)   History   Loss of Consciousness   HPI  Danny Ater. is a 52 y.o. male with a history of seizure disorder, hyperlipidemia, depression, alcohol and cocaine abuse who presents due to concern for possible seizure.  The patient himself is somnolent and unable to give history.  Per EMS, he came from a private home.  Bystander stated she had been using cocaine and then started having convulsions.  EMS noted convulsions that look like possible seizure activity so they gave Versed.  The convulsions immediately stopped.  The patient then became less responsive with depressed respirations so he received Narcan with some improvement.  I reviewed the past medical records.  The patient was seen in the ED in 2022 several times for various complaints.  He was recently admitted to inpatient psychiatry in 2017 for suicidal ideation.   Physical Exam   Triage Vital Signs: ED Triage Vitals  Enc Vitals Group     BP 04/12/23 2200 117/84     Pulse Rate 04/12/23 2200 84     Resp 04/12/23 2200 18     Temp 04/12/23 2200 (!) 96.5 F (35.8 C)     Temp Source 04/12/23 2200 Axillary     SpO2 04/12/23 2200 97 %     Weight 04/12/23 2202 160 lb (72.6 kg)     Height 04/12/23 2202 6\' 2"  (1.88 m)     Head Circumference --      Peak Flow --      Pain Score --      Pain Loc --      Pain Edu? --      Excl. in GC? --     Most recent vital signs: Vitals:   04/12/23 2200 04/12/23 2300  BP: 117/84 106/77  Pulse: 84 67  Resp: 18 15  Temp: (!) 96.5 F (35.8 C)   SpO2: 97% 96%     General: Somnolent, moans and withdraws to painful stimuli. CV:  Good peripheral perfusion.  Resp:  Normal effort.  Abd:  No distention.  Other:  Motor response in all extremities to painful stimuli.  PERRLA.  Intermittent muscle fasciculations to the chest wall and tremors in  the arms.  No visible trauma.   ED Results / Procedures / Treatments   Labs (all labs ordered are listed, but only abnormal results are displayed) Labs Reviewed  COMPREHENSIVE METABOLIC PANEL - Abnormal; Notable for the following components:      Result Value   Potassium 5.4 (*)    Glucose, Bld 179 (*)    Calcium 8.3 (*)    Total Protein 6.2 (*)    Albumin 3.4 (*)    All other components within normal limits  CBC WITH DIFFERENTIAL/PLATELET - Abnormal; Notable for the following components:   WBC 15.5 (*)    MCV 100.4 (*)    Neutro Abs 14.1 (*)    All other components within normal limits  ACETAMINOPHEN LEVEL - Abnormal; Notable for the following components:   Acetaminophen (Tylenol), Serum <10 (*)    All other components within normal limits  SALICYLATE LEVEL - Abnormal; Notable for the following components:   Salicylate Lvl <7.0 (*)    All other components within normal limits  LACTIC ACID, PLASMA - Abnormal; Notable for the following components:   Lactic Acid, Venous 3.1 (*)  All other components within normal limits  ETHANOL  URINE DRUG SCREEN, QUALITATIVE ([facility] ONLY)  URINALYSIS, ROUTINE W REFLEX MICROSCOPIC  LACTIC ACID, PLASMA  TROPONIN I (HIGH SENSITIVITY)  TROPONIN I (HIGH SENSITIVITY)     EKG  ED ECG REPORT I, Dionne Bucy, the attending physician, personally viewed and interpreted this ECG.  Date: 04/12/2023 EKG Time: 2205 Rate: 85 Rhythm: normal sinus rhythm (incorrectly read by machine as atrial fibrillation) QRS Axis: normal Intervals: normal ST/T Wave abnormalities: Repolarization Narrative Interpretation: no evidence of acute ischemia    RADIOLOGY  CT head: I independently viewed and interpreted the images; there is no ICH.  Radiology report indicates the following.  IMPRESSION:  1. Asymmetric generalized cerebral atrophy, as described above, with  chronic white matter small vessel ischemic changes. MRI correlation  is recommended to  exclude right-sided cerebral edema with subsequent  asymmetric sulcal effacement.     PROCEDURES:  Critical Care performed: Yes, see critical care procedure note(s)  .Critical Care  Performed by: Dionne Bucy, MD Authorized by: Dionne Bucy, MD   Critical care provider statement:    Critical care time (minutes):  35   Critical care time was exclusive of:  Separately billable procedures and treating other patients   Critical care was necessary to treat or prevent imminent or life-threatening deterioration of the following conditions:  CNS failure or compromise   Critical care was time spent personally by me on the following activities:  Development of treatment plan with patient or surrogate, discussions with consultants, evaluation of patient's response to treatment, examination of patient, ordering and review of laboratory studies, ordering and review of radiographic studies, ordering and performing treatments and interventions, pulse oximetry, re-evaluation of patient's condition, review of old charts and obtaining history from patient or surrogate    MEDICATIONS ORDERED IN ED: Medications  levETIRAcetam (KEPPRA) 2,000 mg in sodium chloride 0.9 % 250 mL IVPB (0 mg Intravenous Stopped 04/12/23 2308)  LORazepam (ATIVAN) injection 2 mg (2 mg Intravenous Given 04/12/23 2212)  sodium chloride 0.9 % bolus 1,000 mL (1,000 mLs Intravenous New Bag/Given 04/12/23 2328)  sodium chloride 0.9 % bolus 1,000 mL (1,000 mLs Intravenous New Bag/Given 04/12/23 2328)     IMPRESSION / MDM / ASSESSMENT AND PLAN / ED COURSE  I reviewed the triage vital signs and the nursing notes.  52 year old male with PMH as noted above presents due to concern for apparent seizure after he had been using drugs.  The patient is unable to give any history.  On exam his vital signs are normal.  The patient is somnolent but arouses and will moan and move his extremities purposefully to painful stimuli.  However  intermittently he has had some tremors or shaking of the upper extremities and some fasciculations of the muscles in his chest wall.  He is breathing spontaneously and maintaining his airway.  Differential diagnosis includes, but is not limited to, seizure disorder, status epilepticus, intracranial hemorrhage, sympathomimetic overdose, electrolyte abnormality, other metabolic cause.  We will give IV Ativan and Keppra, obtain CT head, lab workup, and reassess.  Is unclear at this time if the intermittent tremors could represent seizure activity; we will continue to monitor closely and consult neurology if the patient has any persistent seizure-like activity.  Patient's presentation is most consistent with acute presentation with potential threat to life or bodily function.  The patient is on the cardiac monitor to evaluate for evidence of arrhythmia and/or significant heart rate changes.   ----------------------------------------- 11:07 PM on 04/12/2023 -----------------------------------------  Lab workup shows leukocytosis and slightly elevated lactate consistent with recent seizure.  CT head shows asymmetric atrophy although some edema cannot be ruled out.  I have ordered an MRI for further evaluation.  On reassessment, the patient is somnolent after the Ativan.  He no longer has any tremors, convulsions, or fasciculations.  I have handed the patient off to the oncoming ED physician Dr. Sidney Ace.  FINAL CLINICAL IMPRESSION(S) / ED DIAGNOSES   Final diagnoses:  Seizure disorder (HCC)     Rx / DC Orders   ED Discharge Orders     None        Note:  This document was prepared using Dragon voice recognition software and may include unintentional dictation errors.    Dionne Bucy, MD 04/12/23 5784    Dionne Bucy, MD 04/12/23 2341

## 2023-04-12 NOTE — ED Triage Notes (Signed)
Pt BIB [facility] from home for unresponsive and sz like activity. Bystanders report pt used crack and then became unresponsive with convulsions x 30 min. EMS noted pinpoint pupils. EMS admin 5mg  of Versed and 1mg  of Narcan. They advised the convulsions stopped after meds. Pt with hx of sz.   EMS Vitals   102/63 HR 66 CBG 258

## 2023-04-12 NOTE — ED Notes (Signed)
X-ray at bedside at this time.

## 2023-04-12 NOTE — ED Notes (Signed)
Seizure pads and bed alarm placed on bed.

## 2023-04-13 ENCOUNTER — Other Ambulatory Visit: Payer: Self-pay

## 2023-04-13 ENCOUNTER — Encounter: Payer: Self-pay | Admitting: Internal Medicine

## 2023-04-13 DIAGNOSIS — Z720 Tobacco use: Secondary | ICD-10-CM

## 2023-04-13 DIAGNOSIS — F1020 Alcohol dependence, uncomplicated: Secondary | ICD-10-CM

## 2023-04-13 DIAGNOSIS — G40919 Epilepsy, unspecified, intractable, without status epilepticus: Secondary | ICD-10-CM

## 2023-04-13 DIAGNOSIS — R739 Hyperglycemia, unspecified: Secondary | ICD-10-CM | POA: Insufficient documentation

## 2023-04-13 DIAGNOSIS — F1410 Cocaine abuse, uncomplicated: Secondary | ICD-10-CM | POA: Diagnosis not present

## 2023-04-13 DIAGNOSIS — D72829 Elevated white blood cell count, unspecified: Secondary | ICD-10-CM | POA: Insufficient documentation

## 2023-04-13 LAB — LACTIC ACID, PLASMA: Lactic Acid, Venous: 1.6 mmol/L (ref 0.5–1.9)

## 2023-04-13 LAB — VALPROIC ACID LEVEL: Valproic Acid Lvl: <10 ug/mL — ABNORMAL LOW (ref 50.0–100.0)

## 2023-04-13 LAB — POTASSIUM: Potassium: 4 mmol/L (ref 3.5–5.1)

## 2023-04-13 MED ORDER — LORAZEPAM 1 MG PO TABS: 1.0000 mg | ORAL_TABLET | ORAL | Status: DC | PRN

## 2023-04-13 MED ORDER — ONDANSETRON HCL 4 MG/2ML IJ SOLN: 4.0000 mg | Freq: Three times a day (TID) | INTRAMUSCULAR | Status: DC | PRN

## 2023-04-13 MED ORDER — THIAMINE HCL 100 MG/ML IJ SOLN: 100.0000 mg | Freq: Every day | INTRAMUSCULAR | Status: DC

## 2023-04-13 MED ORDER — LORAZEPAM 2 MG/ML IJ SOLN: 1.0000 mg | INTRAMUSCULAR | Status: DC | PRN

## 2023-04-13 MED ORDER — HYDROMORPHONE HCL 1 MG/ML IJ SOLN: 0.5000 mg | INTRAMUSCULAR | Status: DC | PRN

## 2023-04-13 MED ORDER — LORAZEPAM 2 MG/ML IJ SOLN: 2.0000 mg | INTRAMUSCULAR | Status: DC | PRN

## 2023-04-13 MED ORDER — THIAMINE MONONITRATE 100 MG PO TABS
100.0000 mg | ORAL_TABLET | Freq: Every day | ORAL | Status: DC
  Filled 2023-04-13: qty 1

## 2023-04-13 MED ORDER — LACTATED RINGERS IV SOLN: INTRAVENOUS | Status: DC

## 2023-04-13 MED ORDER — FOLIC ACID 1 MG PO TABS
1.0000 mg | ORAL_TABLET | Freq: Every day | ORAL | Status: DC
  Filled 2023-04-13: qty 1

## 2023-04-13 MED ORDER — ALBUTEROL SULFATE (2.5 MG/3ML) 0.083% IN NEBU: 2.5000 mg | INHALATION_SOLUTION | RESPIRATORY_TRACT | Status: DC | PRN

## 2023-04-13 MED ORDER — VALPROATE SODIUM 100 MG/ML IV SOLN
500.0000 mg | Freq: Once | INTRAVENOUS | Status: AC
  Administered 2023-04-13: 500 mg via INTRAVENOUS
  Filled 2023-04-13: qty 5

## 2023-04-13 MED ORDER — ADULT MULTIVITAMIN W/MINERALS CH
1.0000 | ORAL_TABLET | Freq: Every day | ORAL | Status: DC
  Filled 2023-04-13: qty 1

## 2023-04-13 NOTE — ED Notes (Signed)
Pt resting in bed. Wife informed this RN that she is stepping outside to get some air

## 2023-04-13 NOTE — ED Notes (Signed)
Pt signed AMA form and witnessed by this RN.

## 2023-04-13 NOTE — ED Notes (Signed)
Pt insistent on leaving. Alvester Morin, MD tried to convince patient to stay. Pt refuses. Pt to sign AMA paperwork at this time. MD aware.

## 2023-04-13 NOTE — ED Notes (Signed)
Wife requesting crackers, peanut butter, and something to drink. These items provided.

## 2023-04-13 NOTE — ED Notes (Signed)
[place], MD at bedside.  

## 2023-04-13 NOTE — ED Notes (Signed)
Wife returned to bedside

## 2023-04-13 NOTE — H&P (Addendum)
History and Physical    Patient: Danny Johnston. ZOX:[id number] DOB: [birth year] DOA: 04/12/2023 DOS: the patient was seen and examined on 04/13/2023 PCP: Pcp, No  Patient coming from: Home  Chief Complaint:  Chief Complaint  Patient presents with   Loss of Consciousness   HPI: Danny Johnston. is a 52 y.o. male with medical history significant of seizure disorder, substance abuse, tobacco abuse, alcohol use presenting with seizure, encephalopathy.  History primarily from patient's girlfriend as patient is lethargic at the bedside.  Per report, patient with seizure-like activity at home after heavy crack cocaine use.  Per the girlfriend, patient has a baseline history of seizure disorder.  He is not taking his antilipid medications as prescribed.  Also with heavy alcohol use.  Drinks multiple tall boys every day.  1 pack/day smoker.  The girlfriend states that they smoked approximately 30 crack cocaine rocks between each other overnight.  EMS was subsequently called.  Patient was given Versed as well as Narcan.   Patient presented to the ER afebrile, hemodynamically stable though transiently requiring 2 L nasal cannula.  Upon arrival to the ER patient was noted to have some upper extremity shaking.  Was subsequently loaded with Keppra as well as IV Ativan with resolution of seizure activity. Review of Systems: unable to review all systems due to the inability of the patient to answer questions. Past Medical History:  Diagnosis Date   Hyperlipemia    Seizures (HCC)    Past Surgical History:  Procedure Laterality Date   APPENDECTOMY     Social History:  reports that he has been smoking cigarettes. He has been smoking an average of 2 packs per day. He has never used smokeless tobacco. He reports current alcohol use. He reports current drug use. Drugs: "Crack" cocaine and Marijuana.  Allergies  Allergen Reactions   Aspirin Swelling and Anaphylaxis   Acetaminophen     Other reaction(s):  Unknown   Other Other (See Comments)    Aspertame - sz    History reviewed. No pertinent family history.  Prior to Admission medications   Medication Sig Start Date End Date Taking? Authorizing Provider  amoxicillin (AMOXIL) 500 MG capsule Take 1 capsule (500 mg total) by mouth 3 (three) times daily. Patient not taking: Reported on 04/13/2023 04/28/20   Enid Derry, PA-C  ARIPiprazole (ABILIFY) 10 MG tablet Take 1 tablet (10 mg total) by mouth at bedtime. Patient not taking: Reported on 04/13/2023 09/21/16   Pucilowska, Braulio Conte B, MD  divalproex (DEPAKOTE) 500 MG DR tablet Take 1 tablet (500 mg total) by mouth every 12 (twelve) hours. Patient not taking: Reported on 04/13/2023 09/21/16   Pucilowska, Braulio Conte B, MD  ibuprofen (ADVIL) 600 MG tablet Take 1 tablet (600 mg total) by mouth every 6 (six) hours as needed. Patient not taking: Reported on 04/13/2023 04/28/20   Enid Derry, PA-C  lidocaine (LIDODERM) 5 % Place 1 patch onto the skin daily. Remove & Discard patch within 12 hours or as directed by MD Patient not taking: Reported on 04/13/2023 09/18/18   Enid Derry, PA-C  lidocaine (XYLOCAINE) 2 % solution Use as directed 10 mLs in the mouth or throat as needed. Patient not taking: Reported on 04/13/2023 04/28/20   Enid Derry, PA-C  predniSONE (DELTASONE) 10 MG tablet Take 6 tablets on day 1, take 5 tablets on day 2, take 4 tablets on day 3, take 3 tablets on day 4, take 2 tablets on day 5, take 1 tablet on  day 6 Patient not taking: Reported on 04/13/2023 09/18/18   Enid Derry, PA-C  tiZANidine (ZANAFLEX) 4 MG tablet Take 1 tablet (4 mg total) by mouth 3 (three) times daily. Patient not taking: Reported on 04/13/2023 05/26/21   Kem Boroughs B, FNP  traZODone (DESYREL) 100 MG tablet Take 1 tablet (100 mg total) by mouth at bedtime. Patient not taking: Reported on 04/13/2023 09/21/16   Kristine Linea B, MD  venlafaxine XR (EFFEXOR-XR) 150 MG 24 hr capsule Take 1 capsule (150 mg  total) by mouth daily with breakfast. Patient not taking: Reported on 04/13/2023 09/22/16   Pucilowska, Ellin Goodie, MD  vitamin B-12 1000 MCG tablet Take 1 tablet (1,000 mcg total) by mouth daily. Patient not taking: Reported on 04/13/2023 09/21/16   [name], MD    Physical Exam: Vitals:   04/13/23 0530 04/13/23 0630 04/13/23 0700 04/13/23 0730  BP: 99/72 113/78 116/76 108/82  Pulse: (!) 59 (!) 50 (!) 52 (!) 52  Resp: 12 10 11  (!) 9  Temp:      TempSrc:      SpO2: 100% 100% 100% 100%  Weight:      Height:       Physical Exam Constitutional:      Comments: Underweight  Lethargic and sleeping at the bedside status post Ativan   HENT:     Head: Normocephalic.     Nose: Nose normal.     Mouth/Throat:     Mouth: Mucous membranes are dry.  Eyes:     Pupils: Pupils are equal, round, and reactive to light.  Cardiovascular:     Rate and Rhythm: Normal rate and regular rhythm.  Pulmonary:     Effort: Pulmonary effort is normal.     Breath sounds: Normal breath sounds.  Abdominal:     General: Abdomen is flat. Bowel sounds are normal.  Musculoskeletal:     Comments: Positive generalized weakness in the setting of lethargy  Neurological:     Comments: Positive generalized lethargy status post Ativan Otherwise grossly nonfocal neuroexam   Psychiatric:        Mood and Affect: Mood normal.     Data Reviewed:  There are no new results to review at this time. MR BRAIN WO CONTRAST CLINICAL DATA:  Initial evaluation for mental status change.  EXAM: MRI HEAD WITHOUT CONTRAST  TECHNIQUE: Multiplanar, multiecho pulse sequences of the brain and surrounding structures were obtained without intravenous contrast.  COMPARISON:  Prior CT from 04/12/2023.  FINDINGS: Brain: Examination mildly degraded by motion artifact.  Mildly advanced cerebral atrophy for age. Scattered patchy T2/FLAIR hyperintensity involving the supratentorial cerebral white matter, nonspecific,  but most commonly related to chronic small vessel ischemic disease. Appearance is mild for age.  No evidence for acute or subacute ischemia. Gray-white matter differentiation maintained. No areas of chronic cortical infarction. No acute or chronic intracranial blood products.  No mass lesion, midline shift or mass effect. No hydrocephalus. Cavum et septum pellucidum noted. No extra-axial fluid collection. Pituitary gland suprasellar region within normal limits. No intrinsic temporal lobe abnormality.  Vascular: Major intracranial vascular flow voids are maintained.  Skull and upper cervical spine: Craniocervical junction within normal limits. Bone marrow signal intensity normal. 3.1 cm lipoma noted at the posterior scalp.  Sinuses/Orbits: Globes and orbital soft tissues within normal limits. Paranasal sinuses are clear. No significant mastoid effusion.  Other: None.  IMPRESSION: 1. No acute intracranial abnormality. 2. Advanced cerebral atrophy for age with mild chronic small vessel ischemic  disease.  Electronically Signed   By: Rise Mu M.D.   On: 04/13/2023 00:57  Lab Results  Component Value Date   WBC 15.5 (H) 04/12/2023   HGB 15.0 04/12/2023   HCT 47.0 04/12/2023   MCV 100.4 (H) 04/12/2023   PLT 164 04/12/2023   Last metabolic panel Lab Results  Component Value Date   GLUCOSE 179 (H) 04/12/2023   NA 140 04/12/2023   K 5.4 (H) 04/12/2023   CL 107 04/12/2023   CO2 26 04/12/2023   BUN 17 04/12/2023   CREATININE 1.22 04/12/2023   GFRNONAA >60 04/12/2023   CALCIUM 8.3 (L) 04/12/2023   PROT 6.2 (L) 04/12/2023   ALBUMIN 3.4 (L) 04/12/2023   BILITOT 0.6 04/12/2023   ALKPHOS 57 04/12/2023   AST 24 04/12/2023   ALT 14 04/12/2023   ANIONGAP 7 04/12/2023    Assessment and Plan: * Breakthrough seizure (HCC) Breakthrough seizures in the setting of cocaine binge Patient also noncompliant with home Depakote referral for CT head with?  Asymmetric  edema with follow-up normal MRI of the brain Status post IV Depakote and Keppra in the ER As needed Ativan Will formally consult neurology Follow-up recommendations  Alcohol use disorder, moderate, dependence (HCC) Heavy beer use per the girlfriend Likely confounding issue to breakthrough seizures CIWA protocol Monitor closely  Leukocytosis White count of 15 as well as lactate of 3.1 in the postictal state No overt source of infection at present Suspect seizure related stress response Will monitor for now IV fluid hydration Reassess if patient spikes a fever Follow closely  Hyperglycemia Blood sugar in 170s Sliding-scale insulin A1c Follow  Cocaine use disorder, moderate, dependence (HCC) Cocaine positive urine drug screen       Advance Care Planning:   Code Status: Prior   Consults: Neurology   Family Communication: Girlfriend at the bedside   Severity of Illness: The appropriate patient status for this patient is INPATIENT. Inpatient status is judged to be reasonable and necessary in order to provide the required intensity of service to ensure the patient's safety. The patient's presenting symptoms, physical exam findings, and initial radiographic and laboratory data in the context of their chronic comorbidities is felt to place them at high risk for further clinical deterioration. Furthermore, it is not anticipated that the patient will be medically stable for discharge from the hospital within 2 midnights of admission.   * I certify that at the point of admission it is my clinical judgment that the patient will require inpatient hospital care spanning beyond 2 midnights from the point of admission due to high intensity of service, high risk for further deterioration and high frequency of surveillance required.*  Greater than 50% was spent in counseling and coordination of care with patient Total encounter time 80 minutes or more  Author: Floydene Flock,  MD 04/13/2023 9:24 AM  For on call review [email or web address].

## 2023-04-13 NOTE — Assessment & Plan Note (Signed)
White count of 15 as well as lactate of 3.1 in the postictal state No overt source of infection at present Suspect seizure related stress response Will monitor for now IV fluid hydration Reassess if patient spikes a fever Follow closely

## 2023-04-13 NOTE — ED Notes (Signed)
Pt continuing to sleep at this time. VSS.

## 2023-04-13 NOTE — Assessment & Plan Note (Signed)
Breakthrough seizures in the setting of cocaine binge Patient also noncompliant with home Depakote referral for CT head with?  Asymmetric edema with follow-up normal MRI of the brain Status post IV Depakote and Keppra in the ER As needed Ativan Will formally consult neurology Follow-up recommendations

## 2023-04-13 NOTE — ED Notes (Addendum)
Pt briefly awake, agreeable with taking PO medications. When this RN trturned to bedside with meds, pt began shaking slightly,however pt is awake and alert and talking to this RN. Upon asking pt if something is wrong, pt stated his hands were numb. Will notify provider. Significant other at bedside endorsing that he is probably having a seizure. Pt remains awake and alert at this time, WCTM. Will hold PO meds at this time. Visitor instructed to not give pt anything by mouth, understanding verbalized.

## 2023-04-13 NOTE — ED Notes (Signed)
Pt requesting to leave at this time. Alvester Morin, MD aware.

## 2023-04-13 NOTE — Assessment & Plan Note (Signed)
Heavy beer use per the girlfriend Likely confounding issue to breakthrough seizures CIWA protocol Monitor closely

## 2023-04-13 NOTE — ED Notes (Signed)
Pt resting at this time. Wife at bedside asking for more saltine crackers

## 2023-04-13 NOTE — ED Notes (Signed)
Pt resting with wife at bedside 

## 2023-04-13 NOTE — ED Provider Notes (Addendum)
Assumed care of this patient.  In brief he presents for seizure-like activity.  Was using crack cocaine prior to presentation.  Received Versed and Narcan with EMS.  Upon arrival was noticed to have some bilateral upper extremity shaking right greater than left.  He received 2 mg of Ativan and was loaded with Keppra, with resolution of seizure-like activity.  On my assessment patient is rather somnolent likely postictal in combination of medication effect.  He has no gaze deviation and is maintaining his airway.  CT head is read as some possible asymmetry recommend MRI to rule out asymmetric edema.  Since lactate is elevated as is his white count which is not unexpected in the setting of seizure.  Will rehydrate and recheck lactate but my suspicion for infectious process at this point is low.  Plan to check a Depakote level.  Will continue to observe for return to baseline.  Patient's MRI is negative.  On reassessment he is awakening but is still sleepy.  Plan to check Depakote level and repeat lactate will continue to observe.  Depakote level is less than 10.  On repeat assessment patient is still rather sleepy but does awaken appropriately he follows commands moves all extremities I very low suspicion for nonconvulsive status.  He tells me that his doctor took him off of Depakote because he has not had a seizure in a while.  I did recommend that he restart it and will give a dose of IV Depakote.  Given he is seizure-free and returning to baseline he do not think that he will necessarily need admission but he will need to be able to walk and have improvement in mental status.  Patient has been in the ED for over 8 hours.  Attempted to ambulate the patient but he was too unsteady and lightheaded unable to ambulate.  At this point I do feel that he will need admission.  I think this is a combination of drug use and being postictal.   Georga Hacking, MD 04/13/23 0102    Georga Hacking,  MD 04/13/23 0447    Georga Hacking, MD 04/13/23 [phone]

## 2023-04-13 NOTE — ED Notes (Signed)
Pt sleeping at this time. VSS. Wife states that she is going outside for a bit and will be back. Respirations even and unlabored. NAD.

## 2023-04-13 NOTE — Assessment & Plan Note (Signed)
Cocaine positive urine drug screen

## 2023-04-13 NOTE — ED Notes (Signed)
Wife at bedside feeding patient. This RN reminded patient that the previous shift nurse informed both patient and wife that he is not to eat anything per MD's order. Pt and wife agreeable.

## 2023-04-13 NOTE — ED Notes (Addendum)
Patient unsuccessful with ambulation trial. Patient lethargic but opens eyes, answers questions and follows commands to verbal stimuli. Patient able to sit up at side of bed but reports dizziness. Patient attempted standing but was extremely unsteady and assisted immediately back to bed. Patient now resting in bed with eyes closed, resp even, unlabored on RA. Sinus bradycardia on monitor at 50's. Significant other at bedside. No distress noted at this time.

## 2023-04-13 NOTE — ED Notes (Signed)
Pt continuing to ask to leave. MD notified.

## 2023-04-13 NOTE — Discharge Summary (Signed)
Physician Discharge Summary   Patient: Danny Johnston. MRN: [id number] DOB: Jan 05, 1971  Admit date:     04/12/2023  Discharge date: 04/13/23  Discharge Physician: Floydene Flock   PCP: Pcp, No   Recommendations at discharge:   Patient noted to have been admitted for breakthrough seizures in the setting of excessive cocaine use as well as concomitant alcohol use.  Patient was loaded with Keppra as well as dosing with Depakote.  Initial plan was for formal neurology evaluation.  However within 46 hours of admission, patient was demanding to go home.  I evaluated patient at bedside and stressed the importance of observation given recent heavy cocaine use and breakthrough seizures.  Patient demanded to go home.  I discussed risks of leaving including death and recurrence of symptoms especially with drug use.  Patient demanding to go home.  Patient left hospital AGAINST MEDICAL ADVICE.  Discharge Diagnoses: Principal Problem:   Breakthrough seizure (HCC) Active Problems:   Alcohol use disorder, moderate, dependence (HCC)   Cocaine use disorder, moderate, dependence (HCC)   Hyperglycemia   Leukocytosis  Resolved Problems:   * No resolved hospital problems. [facility] Course: No notes on file  Assessment and Plan: * Breakthrough seizure (HCC) Breakthrough seizures in the setting of cocaine binge Patient also noncompliant with home Depakote referral for CT head with?  Asymmetric edema with follow-up normal MRI of the brain Status post IV Depakote and Keppra in the ER As needed Ativan Will formally consult neurology Follow-up recommendations  Alcohol use disorder, moderate, dependence (HCC) Heavy beer use per the girlfriend Likely confounding issue to breakthrough seizures CIWA protocol Monitor closely  Leukocytosis White count of 15 as well as lactate of 3.1 in the postictal state No overt source of infection at present Suspect seizure related stress response Will monitor  for now IV fluid hydration Reassess if patient spikes a fever Follow closely  Hyperglycemia Blood sugar in 170s Sliding-scale insulin A1c Follow  Cocaine use disorder, moderate, dependence (HCC) Cocaine positive urine drug screen          Consultants: none  Procedures performed: none  Disposition: Home AGAINST MEDICAL ADVICE Diet recommendation:   DISCHARGE MEDICATION: Allergies as of 04/13/2023       Reactions   Aspirin Swelling, Anaphylaxis   Acetaminophen    Other reaction(s): Unknown   Other Other (See Comments)   Aspertame - sz        Medication List     ASK your doctor about these medications    amoxicillin 500 MG capsule Commonly known as: AMOXIL Take 1 capsule (500 mg total) by mouth 3 (three) times daily.   ARIPiprazole 10 MG tablet Commonly known as: ABILIFY Take 1 tablet (10 mg total) by mouth at bedtime.   cyanocobalamin 1000 MCG tablet Take 1 tablet (1,000 mcg total) by mouth daily.   divalproex 500 MG DR tablet Commonly known as: DEPAKOTE Take 1 tablet (500 mg total) by mouth every 12 (twelve) hours.   ibuprofen 600 MG tablet Commonly known as: ADVIL Take 1 tablet (600 mg total) by mouth every 6 (six) hours as needed.   lidocaine 2 % solution Commonly known as: XYLOCAINE Use as directed 10 mLs in the mouth or throat as needed.   lidocaine 5 % Commonly known as: Lidoderm Place 1 patch onto the skin daily. Remove & Discard patch within 12 hours or as directed by MD   predniSONE 10 MG tablet Commonly known as: DELTASONE Take 6 tablets  on day 1, take 5 tablets on day 2, take 4 tablets on day 3, take 3 tablets on day 4, take 2 tablets on day 5, take 1 tablet on day 6   tiZANidine 4 MG tablet Commonly known as: Zanaflex Take 1 tablet (4 mg total) by mouth 3 (three) times daily.   traZODone 100 MG tablet Commonly known as: DESYREL Take 1 tablet (100 mg total) by mouth at bedtime.   venlafaxine XR 150 MG 24 hr capsule Commonly  known as: EFFEXOR-XR Take 1 capsule (150 mg total) by mouth daily with breakfast.        Discharge Exam: Filed Weights   04/12/23 2202  Weight: 72.6 kg   Same as noted on H&P  Condition at discharge: Left AGAINST MEDICAL ADVICE  The results of significant diagnostics from this hospitalization (including imaging, microbiology, ancillary and laboratory) are listed below for reference.   Imaging Studies: MR BRAIN WO CONTRAST  Result Date: 04/13/2023 CLINICAL DATA:  Initial evaluation for mental status change. EXAM: MRI HEAD WITHOUT CONTRAST TECHNIQUE: Multiplanar, multiecho pulse sequences of the brain and surrounding structures were obtained without intravenous contrast. COMPARISON:  Prior CT from 04/12/2023. FINDINGS: Brain: Examination mildly degraded by motion artifact. Mildly advanced cerebral atrophy for age. Scattered patchy T2/FLAIR hyperintensity involving the supratentorial cerebral white matter, nonspecific, but most commonly related to chronic small vessel ischemic disease. Appearance is mild for age. No evidence for acute or subacute ischemia. Gray-white matter differentiation maintained. No areas of chronic cortical infarction. No acute or chronic intracranial blood products. No mass lesion, midline shift or mass effect. No hydrocephalus. Cavum et septum pellucidum noted. No extra-axial fluid collection. Pituitary gland suprasellar region within normal limits. No intrinsic temporal lobe abnormality. Vascular: Major intracranial vascular flow voids are maintained. Skull and upper cervical spine: Craniocervical junction within normal limits. Bone marrow signal intensity normal. 3.1 cm lipoma noted at the posterior scalp. Sinuses/Orbits: Globes and orbital soft tissues within normal limits. Paranasal sinuses are clear. No significant mastoid effusion. Other: None. IMPRESSION: 1. No acute intracranial abnormality. 2. Advanced cerebral atrophy for age with mild chronic small vessel  ischemic disease. Electronically Signed   By: Rise Mu M.D.   On: 04/13/2023 00:57   DG Abd Portable 1 View  Result Date: 04/12/2023 CLINICAL DATA:  MRI screening EXAM: PORTABLE PELVIS 1-2 VIEWS; PORTABLE ABDOMEN - 1 VIEW COMPARISON:  None Available. FINDINGS: Nonobstructive bowel-gas pattern with moderate stool in the ascending colon. Sutures about the right lower quadrant. No radiopaque foreign body to prohibit MRI. There is no evidence of pelvic fracture or diastasis. No pelvic bone lesions are seen. IMPRESSION: Negative. Electronically Signed   By: Minerva Fester M.D.   On: 04/12/2023 23:34   DG Pelvis Portable  Result Date: 04/12/2023 CLINICAL DATA:  MRI screening EXAM: PORTABLE PELVIS 1-2 VIEWS; PORTABLE ABDOMEN - 1 VIEW COMPARISON:  None Available. FINDINGS: Nonobstructive bowel-gas pattern with moderate stool in the ascending colon. Sutures about the right lower quadrant. No radiopaque foreign body to prohibit MRI. There is no evidence of pelvic fracture or diastasis. No pelvic bone lesions are seen. IMPRESSION: Negative. Electronically Signed   By: Minerva Fester M.D.   On: 04/12/2023 23:34   DG Chest Port 1 View  Result Date: 04/12/2023 CLINICAL DATA:  MRI clearance.  Unresponsive after crack cocaine use EXAM: PORTABLE CHEST 1 VIEW COMPARISON:  None Available. FINDINGS: 09/18/2016. Stable cardiomediastinal silhouette. Aortic atherosclerotic calcification. Pulmonary vascular congestion. No focal consolidation, pleural effusion, or pneumothorax. No displaced  rib fractures. No metallic foreign body to prohibit MRI. IMPRESSION: 1. No metallic foreign body to prohibit MRI. 2. Pulmonary vascular congestion. Electronically Signed   By: Minerva Fester M.D.   On: 04/12/2023 23:33   CT Head Wo Contrast  Result Date: 04/12/2023 CLINICAL DATA:  Unresponsive status post seizure-like activity. EXAM: CT HEAD WITHOUT CONTRAST TECHNIQUE: Contiguous axial images were obtained from the base of  the skull through the vertex without intravenous contrast. RADIATION DOSE REDUCTION: This exam was performed according to the departmental dose-optimization program which includes automated exposure control, adjustment of the mA and/or kV according to patient size and/or use of iterative reconstruction technique. COMPARISON:  Twenty-fifth 2021 FINDINGS: Brain: There is mild cerebral atrophy, left greater than right, with widening of the extra-axial spaces and ventricular dilatation. There are areas of decreased attenuation within the white matter tracts of the supratentorial brain, consistent with microvascular disease changes. Cavum septum pellucidum et vergae is noted. Bilateral symmetric basal ganglia calcification is seen. Vascular: No hyperdense vessel or unexpected calcification. Skull: Normal. Negative for fracture or focal lesion. Sinuses/Orbits: No acute finding. Other: None. IMPRESSION: 1. Asymmetric generalized cerebral atrophy, as described above, with chronic white matter small vessel ischemic changes. MRI correlation is recommended to exclude right-sided cerebral edema with subsequent asymmetric sulcal effacement. Electronically Signed   By: Aram Candela M.D.   On: 04/12/2023 22:47    Microbiology: No results found for this or any previous visit.  Labs: CBC: Recent Labs  Lab 04/12/23 2215  WBC 15.5*  NEUTROABS 14.1*  HGB 15.0  HCT 47.0  MCV 100.4*  PLT 164   Basic Metabolic Panel: Recent Labs  Lab 04/12/23 2215 04/13/23 0953  NA 140  --   K 5.4* 4.0  CL 107  --   CO2 26  --   GLUCOSE 179*  --   BUN 17  --   CREATININE 1.22  --   CALCIUM 8.3*  --    Liver Function Tests: Recent Labs  Lab 04/12/23 2215  AST 24  ALT 14  ALKPHOS 57  BILITOT 0.6  PROT 6.2*  ALBUMIN 3.4*   CBG: No results for input(s): "GLUCAP" in the last 168 hours.  Discharge time spent: less than 30 minutes.  Signed: Floydene Flock, MD [facility] 04/13/2023

## 2023-04-13 NOTE — Progress Notes (Signed)
Received report from nursing about patient wanted to leave AMA Patient evaluated the bedside.  Discussed with patient that there is a high risk of recurrence of stroke in worse issues especially with active cocaine use. Patient repeatedly expressed that he wants to go home Noted to be alert and oriented x 3 though mildly weak Both patient and his girlfriend the bedside repeatedly demanding to go home Discussed with patient that he will have to leave hospital AGAINST MEDICAL ADVICE Discussed with nursing

## 2023-04-13 NOTE — Assessment & Plan Note (Signed)
Blood sugar in 170s Sliding-scale insulin A1c Follow

## 2024-04-05 ENCOUNTER — Emergency Department
Admission: EM | Admit: 2024-04-05 | Discharge: 2024-04-05 | Disposition: A | Discharge status: Home / Self Care | Payer: MEDICAID | Attending: Emergency Medicine | Admitting: Emergency Medicine

## 2024-04-05 ENCOUNTER — Other Ambulatory Visit: Payer: Self-pay

## 2024-04-05 DIAGNOSIS — L720 Epidermal cyst: Secondary | ICD-10-CM | POA: Insufficient documentation

## 2024-04-05 NOTE — ED Provider Notes (Signed)
 [facility] Provider Note    Event Date/Time   First MD Initiated Contact with Patient 04/05/24 2129     (approximate)   History   Cyst    HPI  Danny Johnston. is a 53 y.o. male    with a past medical history of seizures, dental infection, laceration fifth finger, facial laceration, laceration of left finger, history who presents to the ED complaining of mass in his head. According to the patient, he is here because his relatives have asked him to come to ED. patient states he has not days cyst on his coronal area of the head in the last 3 years.  Patient denies any tenderness or warmness in the area.  Patient denies any fever, chest pain, abdominal pain, urinary symptoms.        Physical Exam   Triage Vital Signs: ED Triage Vitals  Encounter Vitals Group     BP 04/05/24 2116 116/78     Systolic BP Percentile --      Diastolic BP Percentile --      Pulse Rate 04/05/24 2116 78     Resp 04/05/24 2116 18     Temp 04/05/24 2116 98.3 F (36.8 C)     Temp Source 04/05/24 2116 Oral     SpO2 04/05/24 2116 100 %     Weight 04/05/24 2115 160 lb (72.6 kg)     Height 04/05/24 2115 6\' 3"  (1.905 m)     Head Circumference --      Peak Flow --      Pain Score 04/05/24 2118 0     Pain Loc --      Pain Education --      Exclude from Growth Chart --     Most recent vital signs: Vitals:   04/05/24 2116  BP: 116/78  Pulse: 78  Resp: 18  Temp: 98.3 F (36.8 C)  SpO2: 100%     Constitutional: Alert, NAD. Able to speak in complete sentences without cough or dyspnea  Eyes: Conjunctivae are normal.  Head: Atraumatic.  Presence of cyst about 6 cm of diameter in the coronal area of the head, not tender to palpation no erythema, no warmness, no presence of posterior loss of vesicles, it is mobile, no induration. Nose: No congestion/rhinnorhea. Mouth/Throat: Mucous membranes are moist.   Neck: Painless ROM. Supple. No JVD, nodes, thyromegaly   Cardiovascular:   Good peripheral circulation.RRR no murmurs, gallops, rubs  Respiratory: Normal respiratory effort.  No retractions. Clear to auscultation bilaterally without wheezing or crackles  Gastrointestinal: Soft and nontender.  Musculoskeletal:  no deformity Neurologic:  MAE spontaneously. No gross focal neurologic deficits are appreciated.  Skin:  Skin is warm, dry and intact. No rash noted. Psychiatric: Mood and affect are normal. Speech and behavior are normal.    ED Results / Procedures / Treatments   Labs (all labs ordered are listed, but only abnormal results are displayed) Labs Reviewed - No data to display   EKG     RADIOLOGY     PROCEDURES:  Critical Care performed:   Procedures   MEDICATIONS ORDERED IN ED: Medications - No data to display    IMPRESSION / MDM / ASSESSMENT AND PLAN / ED COURSE  I reviewed the triage vital signs and the nursing notes.  Differential diagnosis includes, but is not limited to, cyst, abscess, foreign body, mass  Patient's presentation is most consistent with acute, uncomplicated illness.   Danny Johnstonis  a53 y.o.male presents today with a history of cyst in the last 3 years.  Patient states he came today because his family asked him to come to ED. cyst is not bothering the patient, it is a cyst and symptomatic and is not presenting signs of infection.  Patient's diagnosis is consistent with cyst of the scalp.  I did not order x-ray or labs because physical exam is reassuring, no signs of infection.  I did review the patient's allergies and medications.The patient is in stable and satisfactory condition for discharge home  Patient will be discharged home without prescriptions. Patient is to follow up with dermatology as needed or otherwise directed. Patient is given ED precautions to return to the ED for any worsening or new symptoms. Discussed plan of care with patient, answered all of patient's questions, Patient  agreeable to plan of care. . Patient verbalized understanding.   FINAL CLINICAL IMPRESSION(S) / ED DIAGNOSES   Final diagnoses:  Epidermoid cyst     Rx / DC Orders   ED Discharge Orders     None        Note:  This document was prepared using Dragon voice recognition software and may include unintentional dictation errors.   Awilda Lennox, PA-C 04/05/24 2215    Danny Maudlin, MD 04/05/24 2326

## 2024-04-05 NOTE — Discharge Instructions (Addendum)
 You have been diagnosed with epidermoid cyst.  Please call dermatology and make an appointment for for removal of the cyst.  Come back to ED or go to your primary care physician if you have new symptoms or symptoms worsen.

## 2024-04-05 NOTE — ED Triage Notes (Signed)
 Pt arrives with c/o cyst on top of his head that has been there for years. Per pt, it has grown in size in the past few years. Pt denies pain.
